# Patient Record
Sex: Female | Born: 1965
Health system: Southern US, Community
[De-identification: ages and names within clinical notes are randomized; demographics above are authoritative.]

## PROBLEM LIST (undated history)

## (undated) DIAGNOSIS — R002 Palpitations: Secondary | ICD-10-CM

## (undated) DIAGNOSIS — G43909 Migraine, unspecified, not intractable, without status migrainosus: Secondary | ICD-10-CM

## (undated) DIAGNOSIS — B349 Viral infection, unspecified: Secondary | ICD-10-CM

## (undated) DIAGNOSIS — F3281 Premenstrual dysphoric disorder: Secondary | ICD-10-CM

## (undated) DIAGNOSIS — K219 Gastro-esophageal reflux disease without esophagitis: Secondary | ICD-10-CM

## (undated) DIAGNOSIS — M1990 Unspecified osteoarthritis, unspecified site: Secondary | ICD-10-CM

## (undated) HISTORY — DX: Migraine, unspecified, not intractable, without status migrainosus: G43.909

## (undated) HISTORY — DX: Gastro-esophageal reflux disease without esophagitis: K21.9

## (undated) HISTORY — PX: OVARIAN CYST SURGERY: SHX726

## (undated) HISTORY — DX: Palpitations: R00.2

## (undated) HISTORY — DX: Unspecified osteoarthritis, unspecified site: M19.90

## (undated) HISTORY — DX: Viral infection, unspecified: B34.9

## (undated) HISTORY — DX: Premenstrual dysphoric disorder: F32.81

---

## 1998-01-12 ENCOUNTER — Ambulatory Visit (HOSPITAL_COMMUNITY): Admission: RE | Admit: 1998-01-12 | Discharge: 1998-01-12 | Payer: Self-pay | Admitting: *Deleted

## 1998-01-12 ENCOUNTER — Encounter: Payer: Self-pay | Admitting: *Deleted

## 1998-02-02 ENCOUNTER — Other Ambulatory Visit: Admission: RE | Admit: 1998-02-02 | Discharge: 1998-02-02 | Payer: Self-pay | Admitting: Gynecology

## 1998-12-24 ENCOUNTER — Other Ambulatory Visit: Admission: RE | Admit: 1998-12-24 | Discharge: 1998-12-24 | Payer: Self-pay | Admitting: Gynecology

## 1999-06-03 ENCOUNTER — Other Ambulatory Visit: Admission: RE | Admit: 1999-06-03 | Discharge: 1999-06-03 | Payer: Self-pay | Admitting: Gynecology

## 2000-07-18 ENCOUNTER — Other Ambulatory Visit: Admission: RE | Admit: 2000-07-18 | Discharge: 2000-07-18 | Payer: Self-pay | Admitting: Gynecology

## 2003-07-07 ENCOUNTER — Encounter: Admission: RE | Admit: 2003-07-07 | Discharge: 2003-07-07 | Payer: Self-pay | Admitting: Cardiovascular Disease

## 2005-01-10 ENCOUNTER — Encounter: Payer: Self-pay | Admitting: Cardiology

## 2005-04-14 ENCOUNTER — Ambulatory Visit: Payer: Self-pay | Admitting: Psychiatry

## 2005-09-14 ENCOUNTER — Ambulatory Visit: Payer: Self-pay | Admitting: Gastroenterology

## 2005-09-19 ENCOUNTER — Ambulatory Visit: Payer: Self-pay | Admitting: Internal Medicine

## 2006-09-29 ENCOUNTER — Encounter: Payer: Self-pay | Admitting: Cardiology

## 2006-10-02 ENCOUNTER — Encounter: Payer: Self-pay | Admitting: Cardiology

## 2006-11-13 ENCOUNTER — Ambulatory Visit: Payer: Self-pay | Admitting: Gastroenterology

## 2006-11-13 LAB — CONVERTED CEMR LAB
ALT: 14 units/L (ref 0–35)
AST: 18 units/L (ref 0–37)
Albumin: 4.2 g/dL (ref 3.5–5.2)
CO2: 31 meq/L (ref 19–32)
Calcium: 9.3 mg/dL (ref 8.4–10.5)
Creatinine, Ser: 0.7 mg/dL (ref 0.4–1.2)
GFR calc Af Amer: 120 mL/min
HCT: 39.7 % (ref 36.0–46.0)
Lipase: 22 units/L (ref 11.0–59.0)
Lymphocytes Relative: 25.7 % (ref 12.0–46.0)
MCHC: 34.7 g/dL (ref 30.0–36.0)
MCV: 93.9 fL (ref 78.0–100.0)
Platelets: 273 10*3/uL (ref 150–400)
Potassium: 4 meq/L (ref 3.5–5.1)
RBC: 4.23 M/uL (ref 3.87–5.11)
RDW: 12.1 % (ref 11.5–14.6)
Sodium: 144 meq/L (ref 135–145)
Total Bilirubin: 0.9 mg/dL (ref 0.3–1.2)
Total Protein: 7 g/dL (ref 6.0–8.3)

## 2006-11-14 ENCOUNTER — Ambulatory Visit: Payer: Self-pay | Admitting: Gastroenterology

## 2007-01-20 ENCOUNTER — Emergency Department (HOSPITAL_COMMUNITY): Admission: EM | Admit: 2007-01-20 | Discharge: 2007-01-20 | Payer: Self-pay | Admitting: Emergency Medicine

## 2007-01-30 ENCOUNTER — Ambulatory Visit: Payer: Self-pay | Admitting: Gastroenterology

## 2007-03-22 DIAGNOSIS — K219 Gastro-esophageal reflux disease without esophagitis: Secondary | ICD-10-CM | POA: Insufficient documentation

## 2007-03-22 DIAGNOSIS — G43709 Chronic migraine without aura, not intractable, without status migrainosus: Secondary | ICD-10-CM

## 2007-03-22 DIAGNOSIS — G43909 Migraine, unspecified, not intractable, without status migrainosus: Secondary | ICD-10-CM | POA: Insufficient documentation

## 2007-03-22 DIAGNOSIS — J309 Allergic rhinitis, unspecified: Secondary | ICD-10-CM | POA: Insufficient documentation

## 2007-03-22 DIAGNOSIS — I059 Rheumatic mitral valve disease, unspecified: Secondary | ICD-10-CM | POA: Insufficient documentation

## 2007-12-07 ENCOUNTER — Encounter: Payer: Self-pay | Admitting: Cardiology

## 2008-01-03 ENCOUNTER — Ambulatory Visit (HOSPITAL_COMMUNITY): Admission: RE | Admit: 2008-01-03 | Discharge: 2008-01-03 | Payer: Self-pay

## 2008-06-17 ENCOUNTER — Encounter: Payer: Self-pay | Admitting: Cardiology

## 2009-05-13 ENCOUNTER — Telehealth (INDEPENDENT_AMBULATORY_CARE_PROVIDER_SITE_OTHER): Payer: Self-pay | Admitting: *Deleted

## 2009-07-31 ENCOUNTER — Ambulatory Visit: Payer: Self-pay | Admitting: Cardiology

## 2009-07-31 DIAGNOSIS — F17200 Nicotine dependence, unspecified, uncomplicated: Secondary | ICD-10-CM | POA: Insufficient documentation

## 2009-07-31 DIAGNOSIS — R079 Chest pain, unspecified: Secondary | ICD-10-CM | POA: Insufficient documentation

## 2009-07-31 DIAGNOSIS — R002 Palpitations: Secondary | ICD-10-CM | POA: Insufficient documentation

## 2009-12-07 ENCOUNTER — Telehealth: Payer: Self-pay | Admitting: Cardiology

## 2009-12-07 ENCOUNTER — Emergency Department (HOSPITAL_COMMUNITY): Admission: EM | Admit: 2009-12-07 | Discharge: 2009-12-07 | Payer: Self-pay | Admitting: Emergency Medicine

## 2010-01-29 ENCOUNTER — Ambulatory Visit: Payer: Self-pay | Admitting: Cardiology

## 2010-01-29 ENCOUNTER — Telehealth: Payer: Self-pay | Admitting: Cardiology

## 2010-01-29 LAB — CONVERTED CEMR LAB
ALT: 16 U/L
AST: 18 U/L
Albumin: 4.5 g/dL
Alkaline Phosphatase: 64 U/L
BUN: 9 mg/dL
Bilirubin, Direct: 0.1 mg/dL
CO2: 29 meq/L
Calcium: 9.4 mg/dL
Chloride: 103 meq/L
Creatinine, Ser: 0.7 mg/dL
GFR calc non Af Amer: 94.15 mL/min
Glucose, Bld: 84 mg/dL
Potassium: 3.9 meq/L
Sodium: 140 meq/L
TSH: 0.88 u[IU]/mL
Total Bilirubin: 1 mg/dL
Total Protein: 7.2 g/dL

## 2010-02-11 ENCOUNTER — Telehealth: Payer: Self-pay | Admitting: Gastroenterology

## 2010-02-15 ENCOUNTER — Ambulatory Visit: Payer: Self-pay | Admitting: Gastroenterology

## 2010-02-15 DIAGNOSIS — R109 Unspecified abdominal pain: Secondary | ICD-10-CM | POA: Insufficient documentation

## 2010-02-16 ENCOUNTER — Encounter
Admission: RE | Admit: 2010-02-16 | Discharge: 2010-02-16 | Payer: Self-pay | Source: Home / Self Care | Attending: Psychiatry | Admitting: Psychiatry

## 2010-02-23 ENCOUNTER — Ambulatory Visit: Payer: Self-pay | Admitting: Cardiology

## 2010-02-23 ENCOUNTER — Ambulatory Visit: Payer: Self-pay

## 2010-03-30 ENCOUNTER — Ambulatory Visit
Admission: RE | Admit: 2010-03-30 | Discharge: 2010-03-30 | Payer: Self-pay | Source: Home / Self Care | Attending: Gastroenterology | Admitting: Gastroenterology

## 2010-03-30 ENCOUNTER — Other Ambulatory Visit: Payer: Self-pay | Admitting: Gastroenterology

## 2010-03-30 DIAGNOSIS — R300 Dysuria: Secondary | ICD-10-CM | POA: Insufficient documentation

## 2010-03-30 DIAGNOSIS — B370 Candidal stomatitis: Secondary | ICD-10-CM | POA: Insufficient documentation

## 2010-03-30 LAB — URINALYSIS
Bilirubin Urine: NEGATIVE
Specific Gravity, Urine: <=1.005 (ref 1.000–1.030)
Urine Glucose: NEGATIVE
pH: 6 (ref 5.0–8.0)

## 2010-04-01 NOTE — Progress Notes (Signed)
Summary: pt having pain /wants appt today   Phone Note Call from Patient   Caller: Patient [phone] Reason for Call: Talk to Nurse Summary of Call: pt calling re pain in her ribs, stomach and shoulders, denies chest pain, sob, lightheadedness, has some dizziness but associates this with a poss oncoming migraine-wants an appt today-pls advise  Initial call taken by: Glynda Jaeger,  January 29, 2010 10:04 AM  Follow-up for Phone Call        pt complains of not feeling well.  states that she was seen in the ED one month ago and was told to followup with Dr Antoine Poche but has not.  Pt states she has arm weakness and pain in her back that radiates to arm ribs and in to her arms.  She doesn't have a primary MD that can see her today and wants to be evaluated here by Dr Antoine Poche.  Appt given for 12N Follow-up by: Charolotte Capuchin, RN,  January 29, 2010 11:03 AM

## 2010-04-01 NOTE — Assessment & Plan Note (Signed)
Summary: Nausea/Abdominal Pain/LRH   History of Present Illness Visit Type: Follow-up Visit Primary GI MD: Elie Goody MD [facility] Primary Provider: [name], MD Chief Complaint: nausea and abdominal pain radiating to back  History of Present Illness:   Mrs. Westerfeld relates problems with chest pain, abdominal pain, and nausea. She was evaluated in the emergency room in October and I have reviewed those records. She states she took Prilosec OTC, which helped her reflux symptoms, but led to abdominal cramps and therefore, she discontinued it. Ranitidine led to increased gas and she discontinued it. She been maintained on Protonix 40 mg h.s. for about 2 months with good control of her reflux symptoms.  However, she has persistent problems with sharp chest pains along her anterior chest wall and bilateral ribs and nausea. She relates intermittent, crampy, lower abdominal discomfort associated with a slight irregularity in bowel habits.   GI Review of Systems    Reports abdominal pain, chest pain, loss of appetite, and  nausea.     Location of  Abdominal pain: lower abdomen.    Denies acid reflux, belching, bloating, dysphagia with liquids, dysphagia with solids, heartburn, vomiting, vomiting blood, weight loss, and  weight gain.      Reports change in bowel habits.     Denies anal fissure, black tarry stools, constipation, diarrhea, diverticulosis, fecal incontinence, heme positive stool, hemorrhoids, irritable bowel syndrome, jaundice, light color stool, liver problems, rectal bleeding, and  rectal pain.   Current Medications (verified): 1)  Atenolol 25 Mg Tabs (Atenolol) .Marland Kitchen.. 1 By Mouth At Bedtime 2)  Protonix 40 Mg Solr (Pantoprazole Sodium) .Marland Kitchen.. 1 By Mouth Daily  Allergies (verified): 1)  ! Sulfa 2)  ! Penicillin  Past History:  Past Medical History: MIGRAINE (ICD-346.90) RHINITIS (ICD-477.9) MITRAL VALVE PROLAPSE (ICD-424.0) GERD (ICD-530.81) Cervical and thoracic disk  disease  Past Surgical History: Reviewed history from 03/22/2007 and no changes required. S/P L ovarian dermoid cyst removal  Family History: Reviewed history from 07/31/2009 and no changes required. Mother with [place] onset PVD  No FH of Colon Cancer:  Social History: Reviewed history from 07/31/2009 and no changes required. Non-drinker,  no drug abuse,  current smoker (24 years 1 ppd, was more),  T hree glasses sweet tea, and 2 cups coffee per day.   Married, 1 child  Review of Systems       The pertinent positives and negatives are noted as above and in the HPI. All other ROS were reviewed and were negative.   Vital Signs:  Patient profile:   45 year old female Height:      64 inches Weight:      123 pounds BMI:     21.19 Pulse rate:   84 / minute Pulse rhythm:   regular BP sitting:   128 / 84  (left arm)  Vitals Entered By: Milford Cage NCMA (February 15, 2010 10:36 AM)  Physical Exam  General:  Well developed, well nourished, no acute distress. Head:  Normocephalic and atraumatic. Eyes:  PERRLA, no icterus. Mouth:  No deformity or lesions, dentition normal. Chest Wall:  Anterior and lateral chest wall tenderness bilaterally to palpation Lungs:  Clear throughout to auscultation. Heart:  Regular rate and rhythm; no murmurs, rubs,  or bruits. Abdomen:  Soft and nondistended. No masses, hepatosplenomegaly or hernias noted. Normal bowel sounds. Minimal lower abdominal tenderness to deep palpation. Neurologic:  Alert and  oriented x4;  grossly normal neurologically. Psych:  anxious.    Impression & Recommendations:  Problem # 1:  CHEST PAIN (ICD-786.50) Multifactorial chest pain, primarily musculoskeletal. Further evaluation per her primary physician. There may be a component of GERD exacerbating her chest wall pain as well.  Problem # 2:  GERD (ICD-530.81) Increase pantoprazole to 40 mg twice daily, taken 30 minutes before breakfast and dinner. Intensify all  standard antireflux measures. May use TUMS p.r.n. for breakthrough symptoms.  Problem # 3:  ABDOMINAL PAIN-MULTIPLE SITES (ICD-789.09) Lower abdominal crampy pain, associated with gas and changes in bowel habits. Glycopyrrolate 1 mg twice daily. If symptoms do not adequately respond, consider further evaluation with FOBT, imaging and possibly colonoscopy.  Patient Instructions: 1)  Pick up your prescriptions from your pharmacy.  2)  Avoid foods high in acid content ( tomatoes, citrus juices, spicy foods) . Avoid eating within 3 to 4 hours of lying down or before exercising. Do not over eat; try smaller more frequent meals. Elevate head of bed four inches when sleeping.  3)  Copy sent to : [name], MD 4)  Please schedule a follow-up appointment in 6 weeks.  5)  The medication list was reviewed and reconciled.  All changed / newly prescribed medications were explained.  A complete medication list was provided to the patient / caregiver.  Prescriptions: ROBINUL 1 MG TABS (GLYCOPYRROLATE) one tablet by mouth two times a day  #60 x 11   Entered by:   Christie Nottingham CMA (AAMA)   Authorized by:   Meryl Dare MD [facility]   Signed by:   Christie Nottingham CMA (AAMA) on 02/15/2010   Method used:   Electronically to        [facility]  [id number] (retail[facility]       [address]       [place], [place]  [address]       Ph: [phone] or [phone]       Fax: [phone]   RxID:   [id number] PANTOPRAZOLE SODIUM 40 MG TBEC (PANTOPRAZOLE SODIUM) one tablet by mouth two times a day 30 minutes before breakfast and dinner  #60 x 11   Entered by:   Christie Nottingham CMA (AAMA)   Authorized by:   Meryl Dare MD [facility]   Signed by:   Christie Nottingham CMA (AAMA) on 02/15/2010   Method used:   [facility]        [facility]  [address] (retail[facility]       [address]       [place], [place]  [address]       Ph: [phone] or [phone]       Fax: [phone]   RxID:    [id number]

## 2010-04-01 NOTE — Assessment & Plan Note (Signed)
Summary: pain in back and into chest/arm weakness  Medications Added PROTONIX 40 MG SOLR (PANTOPRAZOLE SODIUM) 1 by mouth daily      Allergies Added:   Visit Type:  Follow-up Primary [name]:  [name], MD  CC:  chest pain.  History of Present Illness: The patient presents for followup of chest discomfort. [name] [name] presented earlier in the year with complaints of atypical pain and was to have an exercise treadmill test. However, [name] [name] canceled this appointment and did not reschedule. [name] [name] says [name] [name] was bitten by a brown recluse which caused her significant problems and inability to walk. [name] [name] now returns for a myriad of symptoms. [name] [name] has a weak feeling like [name] [name] is "coming down with something." [name] [name] is having chest discomfort and [name] [name] points to her lower ribs spreading down into her abdomen and back. [name] [name] says it is there all the time but it "comes and goes". [name] [name] thinks it is getting worse and happens at rest. When [name] [name] tries to work he feels particularly weak especially in her arms. [name] [name] was in the emergency room in October and I reviewed these records. [name] [name] was having chest pain and was felt to be GI in nature. [name] [name] was treated with Mylanta and Protonix. [name] [name] denies any new shortness of breath, PND or orthopnea. [name] [name] denies any new palpitations, presyncope or syncope. [name] [name] continues to smoke and has stress as described in previous notes.  Current Medications (verified): 1)  Atenolol 25 Mg Tabs (Atenolol) .Marland Kitchen.. 1 By Mouth At Bedtime 2)  Protonix 40 Mg Solr (Pantoprazole Sodium) .Marland Kitchen.. 1 By Mouth Daily  Allergies (verified): 1)  ! Sulfa  Past History:  Past Medical History: Reviewed history from 03/22/2007 and no changes required. MIGRAINE (ICD-346.90) RHINITIS (ICD-477.9) MITRAL VALVE PROLAPSE (ICD-424.0) G E R D (ICD-530.81)  Past Surgical History: Reviewed history from 03/22/2007 and no changes required. S/P L ovarian dermoid cyst removal  Review of Systems       As stated in the HPI and  negative for all other systems.   Vital Signs:  Patient profile:   45 year old female Height:      64 inches Weight:      119 pounds BMI:     20.50 Pulse rate:   87 / minute Resp:     16 per minute BP sitting:   140 / 80  (right arm)  Vitals Entered By: Marrion Coy, CNA (January 29, 2010 12:19 PM)  Physical Exam  General:  Well developed, well nourished, in no acute distress. Head:  normocephalic and atraumatic Eyes:  PERRLA/EOM intact; conjunctiva and lids normal. Mouth:  Teeth, gums and palate normal. Oral mucosa normal. Neck:  Neck supple, no JVD. No masses, thyromegaly or abnormal cervical nodes. Chest Wall:  no deformities or breast masses noted Lungs:  Clear bilaterally to auscultation and percussion. Abdomen:  Bowel sounds positive; abdomen soft and non-tender without masses, organomegaly, or hernias noted. No hepatosplenomegaly. Msk:  Back normal, normal gait. Muscle strength and tone normal. Extremities:  No clubbing or cyanosis. Neurologic:  Alert and oriented x 3. Skin:  Intact without lesions or rashes. Cervical Nodes:  no significant adenopathy Inguinal Nodes:  no significant adenopathy Psych:  Normal affect.   Detailed Cardiovascular Exam  Neck    Carotids: Carotids full and equal bilaterally without bruits.      Neck Veins: Normal, no JVD.    Heart    Inspection: no deformities or lifts noted.      Palpation: normal PMI  with no thrills palpable.      Auscultation: regular rate and rhythm, S1, S2 without murmurs, rubs, gallops, or clicks.    Vascular    Abdominal Aorta: no palpable masses, pulsations, or audible bruits.      Femoral Pulses: normal femoral pulses bilaterally.      Pedal Pulses: normal pedal pulses bilaterally.      Radial Pulses: normal radial pulses bilaterally.      Peripheral Circulation: no clubbing, cyanosis, or edema noted with normal capillary refill.     EKG  Procedure date:  01/29/2010  Findings:      sinus rhythm,  rate 74, incomplete right bundle branch block, no acute ST-T wave changes  Impression & Recommendations:  Problem # 1:  CHEST PAIN (ICD-786.50) The patient has many atypical symptoms. However, [name] [name] has multiple risk factors. I would still like to do an exercise treadmill test. Provided this is normal I would not be suggesting further cardiovascular testing but will refer her back to her primary physician. Because of fatigue and other symptoms I will check a basic metabolic profile and TSH as well. Orders: EKG w/ Interpretation (93000) TLB-TSH (Thyroid Stimulating Hormone) (84443-TSH) TLB-BMP (Basic Metabolic Panel-BMET) (80048-METABOL) TLB-Hepatic/Liver Function Pnl (80076-HEPATIC) Treadmill (Treadmill)  Problem # 2:  NICOTINE ADDICTION (ICD-305.1) We have discussed this at length. [name] [name] think [name] [name] would try nicotine patches which is what [name] [name] said before.  Problem # 3:  PALPITATIONS (ICD-785.1) [name] [name] has had no new palpitations. Labs will be drawn as mentioned. Otherwise no change in therapy is planned. Orders: EKG w/ Interpretation (93000) TLB-TSH (Thyroid Stimulating Hormone) (84443-TSH) TLB-BMP (Basic Metabolic Panel-BMET) (80048-METABOL) TLB-Hepatic/Liver Function Pnl (80076-HEPATIC)  Patient Instructions: 1)  Your physician recommends that you schedule a follow-up appointment at the time of your treadmill 2)  Your physician recommends that you have lab work today 3)  Your physician recommends that you continue on your current medications as directed. Please refer to the Current Medication list given to you today. 4)  Your physician has requested that you have an exercise tolerance test.  For further information please visit [email or web address]  Please also follow instruction sheet, as given.

## 2010-04-01 NOTE — Letter (Signed)
Summary: [facility] Office Note  [facility] Office Note   Imported By: Roderic Ovens 08/10/2009 12:23:08  _____________________________________________________________________  External Attachment:    Type:   Image     Comment:   External Document

## 2010-04-01 NOTE — Progress Notes (Signed)
Summary: triage   Phone Note Call from Patient Call back at Home Phone [phone] Call back at cell -- [phone]   Caller: Patient Call For: Dr. Russella Dar Details for Reason: triage Summary of Call: Pt. calls asking to be worked into Dr. Ardell Isaacs schedule asap.  Pt. complains of feeling nauseated constantly, stomach hurts up into backbone, been nonstop x 2 months, to the point she can't take it anymore. Feels shaky, weak. Initial call taken by: Schuyler Amor,  February 11, 2010 9:31 AM  Follow-up for Phone Call        Spoke with patient and gave her an appointment for Monday 02/15/10 @ 10:30am. Chart ordered. Follow-up by: Selinda Michaels RN,  February 11, 2010 9:46 AM

## 2010-04-01 NOTE — Letter (Signed)
Summary: [facility] Return Visit Note  [facility] Return Visit Note   Imported By: Roderic Ovens 08/10/2009 12:24:12  _____________________________________________________________________  External Attachment:    Type:   Image     Comment:   External Document

## 2010-04-01 NOTE — Letter (Signed)
Summary: [facility] Return Visit Note  [facility] Return Visit Note   Imported By: Roderic Ovens 08/10/2009 12:23:42  _____________________________________________________________________  External Attachment:    Type:   Image     Comment:   External Document

## 2010-04-01 NOTE — Assessment & Plan Note (Signed)
Summary: NP6/ [employer]/ PALPS. GERD. / GD  Medications Added ATENOLOL 25 MG TABS (ATENOLOL) 1 by mouth at bedtime      Allergies Added: ! SULFA  Visit Type:  Initial Consult Primary Provider:  [name], MD  CC:  palpitations.  History of Present Illness: The patient presents for evaluation of palpitations with a history of mitral valve prolapse. She also has chest discomfort. She has had palpitations for years and has seen another cardiologist. These have been managed conservatively. I do see that she's had an exercise echocardiogram in 2008 without evidence of ischemia or infarct. There's been any history of mitral valve prolapse though I see an echo that demonstrated none of this in 2005. She says that over the last several months palpitations or come back and increased in frequency. She describes isolated skipped beats that happen more when she's eating. She doesn't describe any sustained tachycardia arrhythmias. She hasn't had any presyncope or syncope. She's had some dizziness and vertigo in the past. She does drink quite a bit of caffeine as described below. She has stress taking care of sick father and mother in a young son as well as being a Horticulturist, commercial. She does get chest discomfort. This happens more when she's having migraines. However, she did have isolated episodes of chest discomfort with or without activity. It seems to be sharp. She doesn't describe radiation. It comes and goes spontaneously. It is difficult for her to quantify.  Current Medications (verified): 1)  Atenolol 25 Mg Tabs (Atenolol) .Marland Kitchen.. 1 By Mouth At Bedtime  Allergies (verified): 1)  ! Sulfa  Past History:  Past Medical History: Last updated: 03/22/2007 MIGRAINE (ICD-346.90) RHINITIS (ICD-477.9) MITRAL VALVE PROLAPSE (ICD-424.0) G E R D (ICD-530.81)  Past Surgical History: Reviewed history from 03/22/2007 and no changes required. S/P L ovarian dermoid cyst removal  Family History: Mother  with [place] onset PVD   Social History: Non-drinker, no drug abuse, current smoker (24 years 1 ppd, was more),  Three glasses sweet tea, and 2 cups coffee per day.  Married, 1 child  Review of Systems       positive for jaw pain, reflux. Negative for all other systems.  Vital Signs:  Patient profile:   45 year old female Height:      64 inches Weight:      119 pounds BMI:     20.50 Pulse rate:   73 / minute Resp:     16 per minute BP sitting:   112 / 76  (right arm)  Vitals Entered By: Marrion Coy, CNA (July 31, 2009 10:59 AM)  Physical Exam  General:  Well developed, well nourished, in no acute distress. Head:  normocephalic and atraumatic Eyes:  PERRLA/EOM intact; conjunctiva and lids normal. Mouth:  Teeth, gums and palate normal. Oral mucosa normal. Neck:  Neck supple, no JVD. No masses, thyromegaly or abnormal cervical nodes. Chest Wall:  no deformities or breast masses noted Lungs:  Clear bilaterally to auscultation and percussion. Abdomen:  Bowel sounds positive; abdomen soft and non-tender without masses, organomegaly, or hernias noted. No hepatosplenomegaly. Msk:  Back normal, normal gait. Muscle strength and tone normal. Extremities:  No clubbing or cyanosis. Neurologic:  Alert and oriented x 3. Skin:  Intact without lesions or rashes. Cervical Nodes:  no significant adenopathy Inguinal Nodes:  no significant adenopathy Psych:  Normal affect.   Detailed Cardiovascular Exam  Neck    Carotids: Carotids full and equal bilaterally without bruits.  Neck Veins: Normal, no JVD.    Heart    Inspection: no deformities or lifts noted.      Palpation: normal PMI with no thrills palpable.      Auscultation: regular rate and rhythm, S1, S2 without murmurs, rubs, gallops, or clicks.    Vascular    Abdominal Aorta: no palpable masses, pulsations, or audible bruits.      Femoral Pulses: normal femoral pulses bilaterally.      Pedal Pulses: normal pedal pulses  bilaterally.      Radial Pulses: normal radial pulses bilaterally.      Peripheral Circulation: no clubbing, cyanosis, or edema noted with normal capillary refill.     EKG  Procedure date:  07/31/2009  Findings:      Sinus rhythm, rate 73, axis within normal limits, intervals within normal limits, no acute ST-T wave changes.  Impression & Recommendations:  Problem # 1:  PALPITATIONS (ICD-785.1) At this point she needs to cut out the caffeine if she wants to treat these. We had a long discussion about this. The same palpitations apparenty had evaluated by cardiologist has some of these records. She would not tolerate further med changes she has not in the past. I would not suggest further testing at this time. Orders: TLB-Lipid Panel (80061-LIPID) Treadmill (Treadmill) In the chest  Problem # 2:  NICOTINE ADDICTION (ICD-305.1) I counseled her for greater than 3 minutes about the need to stop smoking. She probably will try nicotine patches.  Problem # 3:  CHEST PAIN (ICD-786.50) This is somewhat atypical. I will plan an exercise treadmill test.  Patient Instructions: 1)  Your physician recommends that you schedule a follow-up appointment at time of stress test 2)  Your physician recommends that you continue on your current medications as directed. Please refer to the Current Medication list given to you today. 3)  Your physician has requested that you have an exercise tolerance test.  For further information please visit [email or web address]  Please also follow instruction sheet, as given. 4)  Your physician recommends that you return for a FASTING lipid profile: on the same day as your treadmill

## 2010-04-01 NOTE — Letter (Signed)
Summary: [facility] 2005 - 2006  [facility] 2005 - 2006   Imported By: Roderic Ovens 08/10/2009 12:25:25  _____________________________________________________________________  External Attachment:    Type:   Image     Comment:   External Document

## 2010-04-01 NOTE — Progress Notes (Signed)
Summary: pt having problem with heart    Phone Note Call from Patient   Caller: Patient [phone]  or [phone] Reason for Call: Talk to Nurse Summary of Call: pt having some chest pain/back pain, itching, heart "jumping up in her throat, said it's not staying there, it does come back down", does it about twice a day, sometimes more Initial call taken by: Glynda Jaeger,  December 07, 2009 11:23 AM  Follow-up for Phone Call        attempted to call 1st number and got voice mail. Cell phone number - 1 month ago - threw her back out and has had dull chest pain since then - heart "jumps up into her throat, beating fast" - under alot of stress - pain woke her last night from her sleep at 3 am. had upset stomach for 3 weeks fills full all the time, eats and then feels sick, stomach bloated.  heart rate increases for about  5 minutes and then returns to normal, for 3 weeks she reports having "chest pain  that goes all the way thru to her back bone"  has had chest pain around her breast bone for about 2 weeks, constant dull pain.  feels like everything is  drawn up in a knot.  Family HX of CVA, CAD but no personal history, smokes about 1 pack per day, no hormone replacement. didn't have GXT before because she was bitten by a spider and leg was hurting and draining.  no energy, feels sickly.  primary care MD said it was sinus stuff.  Reviewed the above information with Dr Antoine Poche who requested the pt report to the ED at [facility] for evaluation.  called pt back but got a vm - left message for pt to report to ED per Dr Antoine Poche Follow-up by: Charolotte Capuchin, RN,  December 07, 2009 12:28 PM  Additional Follow-up for Phone Call Additional follow up Details #1::        pt aware and will report to the ED. Additional Follow-up by: Charolotte Capuchin, RN,  December 07, 2009 1:45 PM

## 2010-04-01 NOTE — Progress Notes (Signed)
   Recieved records form [facility] forwarded to [facility] for NP [facility]t [facility]  May 13, 2009 11:35 AM

## 2010-04-07 NOTE — Assessment & Plan Note (Signed)
Summary: 6 WEEK+ FU Marnee Guarneri   History of Present Illness Visit Type: Follow-up Visit Primary GI MD: Elie Goody MD [facility] Primary Provider: [name], MD Requesting Provider: n/a Chief Complaint: Patient here for f/u abdominal pain and nausea. She states that she was recently found to have a UTI and still has some lower abdominal pain. She also c/o "thrush in mouth" as well as some nausea. There has also been some pill dysphagia History of Present Illness:   This is a 45 year old female, who returns for followup of GERD and crampy abdominal pain. She developed a urinary tract infection recently, and was treated with nitrofurantoin and since then she has developed pain and her mouth and tongue and a lump in her throat. She also has persistent dysuria. She discontinued Protonix and her reflux symptoms have substantially worsened. She has occasional lower abdominal crampy pains associated with gas that have improved when she sees Robinul, however, she's taken Robinul sparingly.   GI Review of Systems    Reports abdominal pain, acid reflux, bloating, loss of appetite, and  nausea.     Location of  Abdominal pain: lower abdomen.    Denies belching, chest pain, dysphagia with liquids, dysphagia with solids, heartburn, vomiting, vomiting blood, weight loss, and  weight gain.        Denies anal fissure, black tarry stools, change in bowel habit, constipation, diarrhea, diverticulosis, fecal incontinence, heme positive stool, hemorrhoids, irritable bowel syndrome, jaundice, light color stool, liver problems, rectal bleeding, and  rectal pain.   Current Medications (verified): 1)  Atenolol 25 Mg Tabs (Atenolol) .Marland Kitchen.. 1 By Mouth At Bedtime 2)  Robinul 1 Mg Tabs (Glycopyrrolate) .... One Tablet By Mouth Two Times A Day As Needed 3)  Cyclobenzaprine Hcl 10 Mg Tabs (Cyclobenzaprine Hcl) .... Take 1 Tablet By Mouth At Bedtime  Allergies (verified): 1)  ! Sulfa 2)  ! Penicillin  Past  History:  Past Medical History: Reviewed history from 02/15/2010 and no changes required. MIGRAINE (ICD-346.90) RHINITIS (ICD-477.9) MITRAL VALVE PROLAPSE (ICD-424.0) GERD (ICD-530.81) Cervical and thoracic disk disease  Past Surgical History: S/P L ovarian dermoid cyst removal C-Section  Family History: Mother with Mavity onset PVD  No FH of Colon Cancer: Family History of Breast Cancer: PGM Family History of Ovarian Cancer: MGM Family History of Clotting disorder: Mother Family History of Cirrhosis: Personal assistant, hx diverticulitis  Social History: no drug abuse,  current smoker (24 years <1 ppd, was more),   Three glasses sweet tea, and 2 cups coffee per day.   Married, 1 child Alcohol Use - yes-2 glass wine weekend  Review of Systems       The patient complains of anxiety-new, arthritis/joint pain, back pain, menstrual pain, night sweats, thirst - excessive, and urination changes/pain.         The pertinent positives and negatives are noted as above and in the HPI. All other ROS were reviewed and were negative.   Vital Signs:  Patient profile:   45 year old female Height:      64 inches Weight:      124.50 pounds BMI:     21.45 BSA:     1.60 Pulse rate:   84 / minute Pulse rhythm:   regular BP sitting:   108 / 70  (left arm)  Vitals Entered By: Lamona Curl CMA Duncan Dull) (March 30, 2010 9:41 AM)  Physical Exam  General:  Well developed, well nourished, no acute distress. Head:  Normocephalic and atraumatic. Mouth:  white/brown exudate on tongue and posterior pharynx.   Lungs:  Clear throughout to auscultation. Heart:  Regular rate and rhythm; no murmurs, rubs,  or bruits. Abdomen:  Soft, nontender and nondistended. No masses, hepatosplenomegaly or hernias noted. Normal bowel sounds. Neurologic:  Alert and  oriented x4;  grossly normal neurologically. Psych:  Alert and cooperative. Normal mood and affect.  Impression &  Recommendations:  Problem # 1:  CANDIDIASIS OF MOUTH (ICD-112.0) Nystatin swish and swallow for 10 days.  Problem # 2:  DYSURIA (ICD-788.1) Rule out persistent urinary tract infection. Will provide these results to the patient and her primary physician or gynecologist for further management. Orders: TLB-Udip ONLY (81003-UDIP)  Problem # 3:  ABDOMINAL PAIN-MULTIPLE SITES (ICD-789.09) She is encouraged to use of Robinul twice daily on a regular basis to help prevent crampy lower abdominal pain and if her symptoms are not well controlled she will contact us.  Problem # 4:  G E R D (ICD-530.81) Resume pantoprazole 40 mg daily, and standard antireflux measures for long-term management. Long-term compliance with therapy for acid suppression was stressed for optimal control of her reflux symptoms.  Patient Instructions: 1)  Please go directly to the basement to have your labs drawn.  2)  Pick up your prescription from your pharmacy. 3)  Continue Robinul one tablet by mouth two times a day. 4)  Restart Protonix one tablet by mouth every morning.  5)  Copy sent to : [name], MD 6)  The medication list was reviewed and reconciled.  All changed / newly prescribed medications were explained.  A complete medication list was provided to the patient / caregiver. 7)  Please schedule a follow-up appointment in 1 year.  Prescriptions: FIRST-DUKES MOUTHWASH  SUSP (DIPHENHYD-HYDROCORT-NYSTATIN) 5 cc swish and swallow four times a day x 10 days  #212mL x 0   Entered by:   Christie Nottingham CMA (AAMA)   Authorized by:   Meryl Dare MD [facility]   Signed by:   Christie Nottingham CMA (AAMA) on 03/30/2010   Method used:   Electronically to        [facility]  [address] (retail)       [address]       [place], [place]  [address]       Ph: [phone] or [phone]       Fax: [phone]   RxID:   [id number] FIRST-DUKES MOUTHWASH  SUSP (DIPHENHYD-HYDROCORT-NYSTATIN) swish and swallow  four times a day x 10 days  #40 x 0   Entered by:   Christie Nottingham CMA (AAMA)   Authorized by:   Meryl Dare MD [facility]   Signed by:   Christie Nottingham CMA (AAMA) on 03/30/2010   Method used:   [facility]        [facility]  [address] (retail)       [address]       [place], [place]  [address]       Ph: [phone] or [phone]       Fax: [phone]   RxID:   [id number]

## 2010-05-13 LAB — POCT I-STAT, CHEM 8
BUN: 5 mg/dL — ABNORMAL LOW (ref 6–23)
Calcium, Ion: 1.23 mmol/L (ref 1.12–1.32)
Creatinine, Ser: 0.9 mg/dL (ref 0.4–1.2)
Glucose, Bld: 96 mg/dL (ref 70–99)
HCT: 41 % (ref 36.0–46.0)

## 2010-05-13 LAB — POCT CARDIAC MARKERS
CKMB, poc: <1 ng/mL — ABNORMAL LOW (ref 1.0–8.0)
Troponin i, poc: <0.05 ng/mL (ref 0.00–0.09)

## 2010-07-13 NOTE — Assessment & Plan Note (Signed)
[facility]                         GASTROENTEROLOGY OFFICE NOTE   NAME:[name] [name], [name] [name]                         MRN:          [id number]  DATE:11/13/2006                            DOB:          19-Mar-1965    This is a 45 year old white female who I have previously evaluated - see  my prior dictations. She relates a return in burning epigastric pain  with associated mild substernal burning. She was treated with Zantac  which improved her symptoms but did not they did not resolve. She  switched to Nexium with substantial improvement in symptoms. She  developed recurrent oral thrush and was treated with Dr. [name] with two courses of Diflucan. Nexium was discontinued, and she  was started on Mylanta. Her epigastric pain has substantially worsened.  It now radiates to the back and is exacerbated by meals. She has been  having post-prandial gas bloating and diarrhea since taking Mylanta 4 to  5 times a day. She notes no dysphagia, odynophagia, vomiting, melena,  hematochezia or weight loss. She states she takes an Excedrin Migraine  about once every 2 weeks. She denies any additional aspirin or NSAID  usage. She denies any recent antibiotic usage except for the Diflucan.   CURRENT MEDICATIONS:  1. Mylanta q.i.d.  2. Excedrin Migraine every 2 weeks p.r.n.   MEDICATION ALLERGIES:  SULFA AND PENICILLIN.   PHYSICAL EXAMINATION:  No acute distress. Weight 117.2 pounds. Blood  pressure is 98/62. Pulse 94 and regular.  HEENT EXAM:  Anicteric sclerae. Oropharynx reveals mild posterior  pharyngeal erythema. There are 1 or 2 small white plaques on her  posterior oropharynx, and her tongue has a yellowish brown  discoloration.  NECK:  Without thyromegaly or adenopathy appreciated.  CHEST:  Clear to auscultation bilaterally.  CARDIAC:  Regular rate and rhythm without murmurs appreciate.  ABDOMEN:  Epigastric tenderness to deep palpation. No rebound or  guarding. No palpable organomegaly, masses or hernias. Normal active  bowel sounds.  NEUROLOGICAL:  Alert and oriented x3. Grossly nonfocal.   ASSESSMENT AND PLAN:  Worsening epigastric pain and reflux symptoms.  Symptoms clearly exacerbated after discontinuing protein-pump  inhibitors. She has recurrent oropharyngeal candidiasis. She is likely  having diarrhea side effects of Mylanta. Discontinue Mylanta and begin  Aciphex 20 mg p.o. q.a.m. May use Tums p.r.n. Obtain a CMET, CBC and  lipase. Will need to retreat for oral candidiasis. Rule out esophageal  candidiasis, GERD, erosive esophagitis and ulcer disease. Risks,  benefits and alternatives of upper endoscopy with  possible biopsy discussed with the patient, and she consents to proceed;  this is scheduled tomorrow.     Venita Lick. Russella Dar, MD, [facility]  Electronically Signed    MTS/MedQ  DD: 11/13/2006  DT: 11/13/2006  Job #: [id number]   cc:   [name]

## 2010-07-13 NOTE — Assessment & Plan Note (Signed)
[facility]                         GASTROENTEROLOGY OFFICE NOTE   NAME:[name], [name] DEWALT                         MRN:          [id number]  DATE:01/30/2007                            DOB:          [birth year]    This is a return office visit for GERD.  She relates some lower  abdominal pain that has started over the past week or two after she  developed a cough.  She is being treated with clarithromycin for an URI.  She feels the Aciphex has controlled her reflux symptoms fairly well but  she notes her symptoms are under better control on Protonix twice a day  in the past.   CURRENT MEDICATIONS:  1. Mylanta q.i.d. p.r.n.  2. Clarithromycin 500 mg b.i.d.  3. Aciphex 20 mg q.a.m.   MEDICATION ALLERGIES:  1. SULFA.  2. PENICILLIN.   EXAM:  No acute distress.  Weight 121.2 pounds, blood pressure is  108/72, pulse 84 and regular.  CHEST:  Clear to auscultation bilaterally.  CARDIAC:  Regular rate without murmurs.  ABDOMEN:  Soft with mild lower abdominal tenderness to light palpation.  No rebound or guarding, no palpable organomegaly, masses or hernias.  Normoactive bowel sounds.   ASSESSMENT AND PLAN:  Gastroesophageal reflux disease with good symptom  control.  Continue standard antireflux measures.  Discontinue Aciphex  and begin pantoprazole 40 milligrams by mouth twice daily with doses  taken 30 minutes before breakfast and dinner.  I believe her lower  abdominal pain is likely mild musculoskeletal pain probably related to  her recent cough.  Return office visit with me in 1 year and as needed.     Venita Lick. Russella Dar, MD, [facility]  Electronically Signed    MTS/MedQ  DD: 01/30/2007  DT: 01/30/2007  Job #: [id number]   cc:   [name] [name]

## 2010-07-16 NOTE — Assessment & Plan Note (Signed)
[facility]                           GASTROENTEROLOGY OFFICE NOTE   [name], [name]                         MRN:          [id number]  DATE:09/14/2005                            DOB:          1965/10/12    [name] [name] is a 45 year old white female that I have seen in the past.  Please refer to the notes from January 2001.  She did not return for  followup, and did not follow through with the recommended testing at the  time.  She has had ongoing problems with intermittent reflux symptoms, which  have been more active for the past month.  She resumed Protonix, and her  reflux symptoms appear to be under better control.  She is more concerned  about recurrent problems with left abdominal and left flank pain for the  past 4 to 5 months, that comes and goes intermittently.  It tends to last  about an hour at a time.  It is described as a stabbing and sharp pain, and  she describes it as moderately severe.  It is not related to bowel movements  or eating.  It has not been associated with bloating, movement or bending.  She does have cervical and thoracic disk disease.  She has had recent  problems with her cervical disk, and has a neurosurgeon that has followed  her, including MRIs of the spine.  She saw her gynecologist, Dr. Arvil [name],  and a pelvis ultrasound was performed that was unremarkable.  She does have  a prior history of a left ovarian cyst surgically removed in 1996.  She did  not feel the problem was gynecologic in etiology.  She has no dysphagia,  odynophagia, change in bowel habits, diarrhea, constipation, melena,  hematochezia or change in stool caliber.  Her weight is stable, and her  appetite is good.  No family history of colon cancer, colon polyps or  inflammatory bowel disease.   PAST MEDICAL HISTORY:  1.  Mitral valve prolapse.  2.  Allergies.  3.  GERD.  4.  Migraine headaches.  5.  Status post a dermoid cyst removed from her  left ovary in 1996.   MEDICATION ALLERGIES:  PENICILLIN and SULFA DRUGS.   CURRENT MEDICATIONS:  None.   SOCIAL HISTORY:  Per the handwritten form.   REVIEW OF SYSTEMS:  Per the handwritten form.   PHYSICAL EXAMINATION:  Well-developed, well-nourished white female in no  acute distress.  Height 5 feet 4 inches, weight 115 pounds.  Blood pressure is 122/70, pulse  76 and regular.  HEENT:  Anicteric sclerae.  Oropharynx is clear.  CHEST:  Clear to auscultation bilaterally.  CARDIAC:  Regular rate and rhythm without murmurs appreciated.  ABDOMEN:  Tenderness along the left lateral abdomen and left flank to deep  palpation.  No rebound or guarding, no palpable organomegaly, masses or  hernias.  Normoactive bowel sounds.  RECTAL:  Examination deferred.  EXTREMITIES:  Without clubbing, cyanosis or edema.  NEUROLOGIC:  Alert and oriented x3.  Grossly nonfocal.   ASSESSMENT AND PLAN:  Left abdominal and left flank  pain.  This does not  appear to be of gastrointestinal origin.  I am concerned about  musculoskeletal pain or radicular pain, possibly related to thoracic spine  disease.  We will obtain a CBC, CMET, and lipase today.  Schedule a CT scan  of the abdomen and pelvis.  If these studies are unremarkable, we should  proceed with upper endoscopy to further evaluate her pain and her chronic  intermittent GERD.  I offered to schedule the endoscopy at this time.  However, she declines.  Begin Nexium 40 mg p.o. q. day, as it is preferred  by her insurance carrier.  Begin all standard antireflux measures.                                   [name] [name]. [name] [name]., MD, [name] [name]   MTS/MedQ  DD:  09/14/2005  DT:  09/15/2005  Job #:  [id number]

## 2010-12-07 LAB — I-STAT 8, (EC8 V) (CONVERTED LAB)
BUN: 9
Bicarbonate: 26.2 — ABNORMAL HIGH
Glucose, Bld: 97
HCT: 48 — ABNORMAL HIGH
Hemoglobin: 16.3 — ABNORMAL HIGH
pH, Ven: 7.339 — ABNORMAL HIGH

## 2010-12-07 LAB — CBC
HCT: 43.1
Hemoglobin: 14.8
MCV: 93.1
RBC: 4.63

## 2010-12-07 LAB — DIFFERENTIAL
Basophils Absolute: 0
Lymphs Abs: 1.6
Monocytes Relative: 6

## 2010-12-07 LAB — POCT I-STAT CREATININE: Operator id: 257131

## 2010-12-22 ENCOUNTER — Emergency Department (HOSPITAL_COMMUNITY): Payer: BC Managed Care – PPO

## 2010-12-22 ENCOUNTER — Emergency Department (HOSPITAL_COMMUNITY)
Admission: EM | Admit: 2010-12-22 | Discharge: 2010-12-22 | Disposition: A | Discharge status: Home / Self Care | Payer: BC Managed Care – PPO | Attending: Emergency Medicine | Admitting: Emergency Medicine

## 2010-12-22 DIAGNOSIS — R05 Cough: Secondary | ICD-10-CM | POA: Insufficient documentation

## 2010-12-22 DIAGNOSIS — I10 Essential (primary) hypertension: Secondary | ICD-10-CM | POA: Insufficient documentation

## 2010-12-22 DIAGNOSIS — R490 Dysphonia: Secondary | ICD-10-CM | POA: Insufficient documentation

## 2010-12-22 DIAGNOSIS — R059 Cough, unspecified: Secondary | ICD-10-CM | POA: Insufficient documentation

## 2010-12-22 DIAGNOSIS — Z79899 Other long term (current) drug therapy: Secondary | ICD-10-CM | POA: Insufficient documentation

## 2011-05-23 ENCOUNTER — Telehealth: Payer: Self-pay | Admitting: Gastroenterology

## 2011-05-23 NOTE — Telephone Encounter (Signed)
Patient feels she has recurrent thrush and wants to come in and see Dr Russella Dar.  She reports dysphagia.  She reports her primary care MD has treated her with clortrimazole losenges.  She is frustrated.  She has been treated multiple times.  She denies any immunosuppressive meds.  Dr Russella Dar should she be seeing you?  Her primary care MD is a at a loss

## 2011-05-23 NOTE — Telephone Encounter (Signed)
No answer and no machine.  I will continue to try and call back

## 2011-05-23 NOTE — Telephone Encounter (Signed)
ENT or PCP would be better than GI for oral thrush We can evaluate dysphagia  She can be worked in with extender

## 2011-05-24 NOTE — Telephone Encounter (Signed)
Patient advised.  She will see her primary care MD.  She states she feels a little better this am after taking clortrimazole lozenges last night.  She says that her dysphagia is better this am.  She will call back if she wants to be evaluated for her dysphagia

## 2011-05-24 NOTE — Telephone Encounter (Signed)
Left message for patient to call back  

## 2011-12-05 ENCOUNTER — Encounter: Payer: Self-pay | Admitting: Gastroenterology

## 2013-10-18 ENCOUNTER — Telehealth: Payer: Self-pay | Admitting: Cardiology

## 2013-10-18 NOTE — Telephone Encounter (Signed)
Pt called in concerned about what is going on with her . She stated that she has been having irregular heartbeats, migraines, and it is causing her to have a lot of anxiety because of it. She like to be fit in sooner than 09/09 with Mickel Baas if possible. Please call  Thanks

## 2013-10-18 NOTE — Telephone Encounter (Signed)
Unable to reach pt or leave a message  

## 2013-10-23 NOTE — Telephone Encounter (Signed)
Spoke to patient.  patient seems very anxious about her condition. She states she did see Delia Chimes NP last week.  Ms fuller did do EKG and may want to do holter monitor but patient has not receive it yet.  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 She also states she started taking Rantidine 150 mg twice a day. She notice after eating palps develop  She also states she had an issue with MVP when she was younger,also she smokes. Patient states she has cut back to 1/2 of what she use to smoke.  RN informed patient we have appointment,10/25/13 at 11 am with [place] PA. Patient states she will take it.RN rescehdule appointment.

## 2013-10-25 ENCOUNTER — Encounter: Payer: Self-pay | Admitting: Cardiology

## 2013-10-25 ENCOUNTER — Ambulatory Visit (INDEPENDENT_AMBULATORY_CARE_PROVIDER_SITE_OTHER): Payer: BC Managed Care – PPO | Admitting: Physician Assistant

## 2013-10-25 ENCOUNTER — Encounter: Payer: Self-pay | Admitting: Physician Assistant

## 2013-10-25 VITALS — BP 112/78 | HR 69 | Ht 65.0 in | Wt 117.3 lb

## 2013-10-25 DIAGNOSIS — K219 Gastro-esophageal reflux disease without esophagitis: Secondary | ICD-10-CM

## 2013-10-25 DIAGNOSIS — F17200 Nicotine dependence, unspecified, uncomplicated: Secondary | ICD-10-CM

## 2013-10-25 DIAGNOSIS — R002 Palpitations: Secondary | ICD-10-CM | POA: Diagnosis not present

## 2013-10-25 DIAGNOSIS — R079 Chest pain, unspecified: Secondary | ICD-10-CM | POA: Diagnosis not present

## 2013-10-25 NOTE — Progress Notes (Signed)
Date:  10/25/2013   ID:  [name] [name], DOB Mar 15, 1965, MRN [id number]  PCP:  Pcp Not In System  Primary Cardiologist:  Hochrein     History of Present Illness: [name] [name] is a 48 y.o. female with a history of mitral valve prolapse, palpitations, GERD, viral syndrome, premenstrual dysphoria.  Patient presents today with complaints of her heart being "offbeat, fluttering". This occurs randomly she can feel it. It started 3 weeks ago. Happens 14-15 times per day. She sometimes feels dizzy but not much. She does drink approximately 3 cups of coffee in the morning and team during the day. She also reports chest pain radiating to her left arm and back. She states it feels "not comfortable". There some diaphoresis. No shortness of breath and it does not happen with exertion. She does continue to smoke less than a pack per day. Has been doing so since age 60. She drinks a glass of wineapproximately 2 times per year  The patient currently denies nausea, vomiting, fever, shortness of breath, orthopnea, PND, cough, congestion, abdominal pain, hematochezia, melena, lower extremity edema, claudication.  Blood pressure is well-controlled EKG shows sinus rhythm rate 69 beats per minutes possibly some H. or enlargement small Q waves inferiorly  Wt Readings from Last 3 Encounters:  10/25/13 117 lb 4.8 oz (53.207 kg)  03/30/10 124 lb 8 oz (56.473 kg)  02/15/10 123 lb (55.792 kg)     Past Medical History  Diagnosis Date  . GERD (gastroesophageal reflux disease)   . Migraine headache   . Viral syndrome   . Palpitations   . Osteoarthritis   . Premenstrual dysphoria     Current Outpatient Prescriptions  Medication Sig Dispense Refill  . atenolol (TENORMIN) 25 MG tablet Take 25 mg by mouth daily.      . ranitidine (ZANTAC) 150 MG tablet Take 150 mg by mouth 2 (two) times daily.       No current facility-administered medications for this visit.    Allergies:    Allergies  Allergen Reactions  .  Esomeprazole Magnesium   . Penicillins   . Sulfonamide Derivatives     Social History:  The patient  reports that she has been smoking Cigarettes.  She has a 19.5 pack-year smoking history. She does not have any smokeless tobacco history on file.   Family history:  No family history on file.  ROS:  Please see the history of present illness.  All other systems reviewed and negative.   PHYSICAL EXAM: VS:  BP 112/78  Pulse 69  Ht 5\' 5"  (1.651 m)  Wt 117 lb 4.8 oz (53.207 kg)  BMI 19.52 kg/m2 Well nourished, well developed, in no acute distress HEENT: Pupils are equal round react to light accommodation extraocular movements are intact.  Neck: no JVDNo cervical lymphadenopathy. Musculoskeletal: Very tender left of mid thoracic area Cardiac: Regular rate and rhythm without murmurs rubs or gallops. Lungs:  clear to auscultation bilaterally, no wheezing, rhonchi or rales Ext: no lower extremity edema.  2+ radial and 1+ dorsalis pedis pulses. Skin: warm and dry Neuro:  Grossly normal  EKG:  Normal sinus rhythm 69 beats per minute, possible H. or enlargement.   ASSESSMENT AND PLAN:  Problem List Items Addressed This Visit   NICOTINE ADDICTION     She has got nicotine patches. Counseled on smoking cessation.    G E R D     Currently taking ranitidine. No acid reflux symptoms noted.    Relevant  Medications      ranitidine (ZANTAC) 150 MG tablet   PALPITATIONS - Primary     Palpitations started approximately 3 weeks ago. Happening 14-15 times per day. We'll order today and monitor.  Blood pressure is currently well-controlled.    Relevant Orders      EKG 12-Lead      Cardiac event monitor   CHEST PAIN     Some typical and atypical features. Has not occurred with exertion. It does radiate to her back and her left arm. Does report some diaphoresis with it as well.  Risk factors include continued tobacco abuse. We'll check a treadmill Myoview.     Other Visit Diagnoses   Chest  pain, unspecified chest pain type        Relevant Orders       Myocardial Perfusion Imaging

## 2013-10-25 NOTE — Patient Instructions (Signed)
1.  Heart monitor 2.  Stress test. 3.  Follow up with Dr. Percival Spanish.

## 2013-10-25 NOTE — Assessment & Plan Note (Signed)
Currently taking ranitidine. No acid reflux symptoms noted.

## 2013-10-25 NOTE — Assessment & Plan Note (Signed)
She has got nicotine patches. Counseled on smoking cessation.

## 2013-10-25 NOTE — Assessment & Plan Note (Signed)
Some typical and atypical features. Has not occurred with exertion. It does radiate to her back and her left arm. Does report some diaphoresis with it as well.  Risk factors include continued tobacco abuse. We'll check a treadmill Myoview.

## 2013-10-25 NOTE — Addendum Note (Signed)
Addended by: Vennie Homans on: 10/25/2013 12:35 PM   Modules accepted: Orders

## 2013-10-25 NOTE — Assessment & Plan Note (Signed)
Palpitations started approximately 3 weeks ago. Happening 14-15 times per day. We'll order today and monitor.  Blood pressure is currently well-controlled.

## 2013-11-05 ENCOUNTER — Telehealth (HOSPITAL_COMMUNITY): Payer: Self-pay

## 2013-11-05 NOTE — Telephone Encounter (Signed)
Encounter complete. 

## 2013-11-06 ENCOUNTER — Ambulatory Visit: Payer: BC Managed Care – PPO | Admitting: Cardiology

## 2013-11-07 ENCOUNTER — Ambulatory Visit (HOSPITAL_COMMUNITY)
Admission: RE | Admit: 2013-11-07 | Discharge: 2013-11-07 | Disposition: A | Discharge status: Home / Self Care | Payer: BC Managed Care – PPO | Source: Ambulatory Visit | Attending: Cardiovascular Disease | Admitting: Cardiovascular Disease

## 2013-11-07 DIAGNOSIS — Z87891 Personal history of nicotine dependence: Secondary | ICD-10-CM | POA: Insufficient documentation

## 2013-11-07 DIAGNOSIS — R079 Chest pain, unspecified: Secondary | ICD-10-CM | POA: Insufficient documentation

## 2013-11-07 DIAGNOSIS — R0602 Shortness of breath: Secondary | ICD-10-CM | POA: Insufficient documentation

## 2013-11-07 DIAGNOSIS — R002 Palpitations: Secondary | ICD-10-CM | POA: Insufficient documentation

## 2013-11-07 DIAGNOSIS — M549 Dorsalgia, unspecified: Secondary | ICD-10-CM | POA: Diagnosis not present

## 2013-11-07 DIAGNOSIS — M79609 Pain in unspecified limb: Secondary | ICD-10-CM | POA: Insufficient documentation

## 2013-11-07 MED ORDER — TECHNETIUM TC 99M SESTAMIBI GENERIC - CARDIOLITE
10.9000 | Freq: Once | INTRAVENOUS | Status: AC | PRN
  Administered 2013-11-07: 10.9 via INTRAVENOUS

## 2013-11-07 MED ORDER — TECHNETIUM TC 99M SESTAMIBI GENERIC - CARDIOLITE
31.8000 | Freq: Once | INTRAVENOUS | Status: AC | PRN
  Administered 2013-11-07: 31.8 via INTRAVENOUS

## 2013-11-07 NOTE — Procedures (Addendum)
[place] [address] [place] [id number] [phone]  Cardiology Nuclear Med Study  [name] [name] is a 48 y.o. female     MRN : [id number]     DOB: Jun 12, 1965  Procedure Date: 11/07/2013  Nuclear Med Background Indication for Stress Test:  Evaluation for Ischemia History:  MVP and No prior respiratory history reported;No prior NUC MPI for comparison. Cardiac Risk Factors: Smoker  Symptoms:  Chest Pain, Dizziness, Palpitations and Left arm and back pain.   Nuclear Pre-Procedure Caffeine/Decaff Intake:  9:00pm NPO After: 7:00am   IV Site: R Forearm  IV 0.9% NS with Angio Cath:  22g  Chest Size (in):  n/a IV Started by: Rolene Course, RN  Height: 5\' 5"  (1.651 m)  Cup Size: A  BMI:  Body mass index is 19.47 kg/(m^2). Weight:  117 lb (53.071 kg)   Tech Comments:  n/a    Nuclear Med Study 1 or 2 day study: 1 day  Stress Test Type:  Stress  Order Authorizing Provider:  Minus Breeding, MD   Resting Radionuclide: Technetium 58m Sestamibi  Resting Radionuclide Dose: 10.9 mCi   Stress Radionuclide:  Technetium 14m Sestamibi  Stress Radionuclide Dose: 31.8 mCi           Stress Protocol Rest HR: 96 Stress HR: 160  Rest BP: 118/89 Stress BP: 143/82  Exercise Time (min): 8 METS: 10.1   Predicted Max HR: 174 bpm % Max HR: 91.95 bpm Rate Pressure Product: 22880  Dose of Adenosine (mg):  n/a Dose of Lexiscan: n/a mg  Dose of Atropine (mg): n/a Dose of Dobutamine: n/a mcg/kg/min (at max HR)  Stress Test Technologist: Leane Para, CCT Nuclear Technologist: Imagene Riches, CNMT   Rest Procedure:  Myocardial perfusion imaging was performed at rest 45 minutes following the intravenous administration of Technetium 78m Sestamibi. Stress Procedure:  The patient performed treadmill exercise using a Bruce  Protocol for 8 minutes. The patient stopped due to SOB and Leg discomfort and denied any chest pain.  There were no  significant ST-T wave changes.  Technetium 104m Sestamibi was injected IV at peak exercise and myocardial perfusion imaging was performed after a brief delay.  Transient Ischemic Dilatation (Normal <1.22):  1.11  QGS EDV:  78 ml QGS ESV:  36 ml LV Ejection Fraction: 54%    Rest ECG: NSR - Normal EKG  Stress ECG: No significant change from baseline ECG  QPS Raw Data Images:  Normal; no motion artifact; normal heart/lung ratio. Stress Images:  Normal homogeneous uptake in all areas of the myocardium. Rest Images:  Normal homogeneous uptake in all areas of the myocardium. Subtraction (SDS):  No evidence of ischemia. LV Wall Motion:  NL LV Function; NL Wall Motion  Impression Exercise Capacity:  Good exercise capacity. BP Response:  Normal blood pressure response. Clinical Symptoms:  No significant symptoms noted. ECG Impression:  No significant ST segment change suggestive of ischemia. Comparison with Prior Nuclear Study: No previous nuclear study performed   Overall Impression:  Normal stress nuclear study.   Sanda Klein, MD  11/07/2013 12:34 PM

## 2013-11-08 ENCOUNTER — Encounter: Payer: Self-pay | Admitting: *Deleted

## 2013-11-15 ENCOUNTER — Telehealth: Payer: Self-pay

## 2013-11-15 ENCOUNTER — Ambulatory Visit (INDEPENDENT_AMBULATORY_CARE_PROVIDER_SITE_OTHER): Payer: BC Managed Care – PPO | Admitting: Cardiology

## 2013-11-15 ENCOUNTER — Encounter: Payer: Self-pay | Admitting: Cardiology

## 2013-11-15 VITALS — BP 118/64 | HR 68 | Ht 64.0 in | Wt 117.0 lb

## 2013-11-15 DIAGNOSIS — R072 Precordial pain: Secondary | ICD-10-CM | POA: Insufficient documentation

## 2013-11-15 DIAGNOSIS — R002 Palpitations: Secondary | ICD-10-CM

## 2013-11-15 NOTE — Patient Instructions (Signed)
Your physician recommends that you schedule a follow-up appointment in: As needed  

## 2013-11-15 NOTE — Telephone Encounter (Signed)
Contacted the patient and she is scheduled for 11/20/13 10:45

## 2013-11-15 NOTE — Telephone Encounter (Signed)
Message copied by Marlon Pel on Fri Nov 15, 2013  4:15 PM ------      Message from: Lucio Edward T      Created: Fri Nov 15, 2013  3:05 PM       Maylon Cos, will do.      Geralynn Capri, can you help her get an appt with me or one of our APPs.            Thx,       MS                  ----- Message -----         From: Minus Breeding, MD         Sent: 11/15/2013  10:35 AM           To: Ladene Artist, MD            Norberto Sorenson,  Can you work this patient in anywhere as a new patient.  You have seen her in the distant past and she has chest pain not thought to be angina (negative stress test.)   Thanks.  Jake       ------

## 2013-11-15 NOTE — Progress Notes (Signed)
HPI The patient was seen here the other day because of palpitations and chest discomfort. She was sent for stress perfusion study which demonstrated an EF of 54% and no evidence of ischemia. She has worn a Holter monitor which demonstrated no arrhythmias.  She has been under quite a bit of stress. She started some Zantac and did have some improvement in her chest discomfort although it still occasionally hurts and aches. She tried to get into see a gastroenterologist or was told initially this might be 6 months. I have called to clarify this. She is looking for a new primary care doctor and I did give her a list of physicians who are accepting new patients. She has lots of pain meds including some back discomfort. She has some abdominal discomfort. She does have fluttering is better since she started Zantac. The discomfort might be slightly better.  Allergies  Allergen Reactions  . Esomeprazole Magnesium   . Penicillins   . Sulfonamide Derivatives     Current Outpatient Prescriptions  Medication Sig Dispense Refill  . atenolol (TENORMIN) 25 MG tablet Take 25 mg by mouth daily.      . ranitidine (ZANTAC) 150 MG tablet Take 150 mg by mouth 2 (two) times daily.       No current facility-administered medications for this visit.    Past Medical History  Diagnosis Date  . GERD (gastroesophageal reflux disease)   . Migraine headache   . Viral syndrome   . Palpitations   . Osteoarthritis   . Premenstrual dysphoria     Past Surgical History  Procedure Laterality Date  . Cesarean section    . Ovarian cyst surgery      ROS:  As stated in the HPI and negative for all other systems.  PHYSICAL EXAM BP 118/64  Pulse 68  Ht 5\' 4"  (1.626 m)  Wt 117 lb (53.071 kg)  BMI 20.07 kg/m2 GENERAL:  Well appearing HEENT:  Pupils equal round and reactive, fundi not visualized, oral mucosa unremarkable NECK:  No jugular venous distention, waveform within normal limits, carotid upstroke brisk and  symmetric, no bruits, no thyromegaly LYMPHATICS:  No cervical, inguinal adenopathy LUNGS:  Clear to auscultation bilaterally BACK:  No CVA tenderness CHEST:  Unremarkable HEART:  PMI not displaced or sustained,S1 and S2 within normal limits, no S3, no S4, no clicks, no rubs, no murmurs ABD:  Flat, positive bowel sounds normal in frequency in pitch, no bruits, no rebound, no guarding, no midline pulsatile mass, no hepatomegaly, no splenomegaly EXT:  2 plus pulses throughout, no edema, no cyanosis no clubbing SKIN:  No rashes no nodules NEURO:  Cranial nerves II through XII grossly intact, motor grossly intact throughout PSYCH:  Cognitively intact, oriented to person place and time   ASSESSMENT AND PLAN  CHEST PAIN:  Her chest pain has predominantly atypical features in that she has a negative stress perfusion study. I think she might have some GI discomfort. I sent a message to Dr. Fuller Plan see if the patient can be seen by GI. She can continue her over-the-counter therapies as well. I also his primary care physician's to establish with.  PALPITATIONS :    She had no arrhythmias on her Holter monitor and I reviewed this today.  At this point we discussed possibly taking a slightly increased dose of her atenolol 12.5 mg at night. She'll let me know if these worsen and we might need to pursue an event monitor.  TOBACCO:  We did  discuss the need to stop smoking.

## 2013-11-20 ENCOUNTER — Ambulatory Visit (INDEPENDENT_AMBULATORY_CARE_PROVIDER_SITE_OTHER): Payer: BC Managed Care – PPO | Admitting: Gastroenterology

## 2013-11-20 ENCOUNTER — Encounter: Payer: Self-pay | Admitting: Gastroenterology

## 2013-11-20 VITALS — BP 124/70 | HR 84 | Ht 63.0 in | Wt 117.5 lb

## 2013-11-20 DIAGNOSIS — R141 Gas pain: Secondary | ICD-10-CM

## 2013-11-20 DIAGNOSIS — F458 Other somatoform disorders: Secondary | ICD-10-CM

## 2013-11-20 DIAGNOSIS — R142 Eructation: Secondary | ICD-10-CM

## 2013-11-20 DIAGNOSIS — R0989 Other specified symptoms and signs involving the circulatory and respiratory systems: Secondary | ICD-10-CM

## 2013-11-20 DIAGNOSIS — R079 Chest pain, unspecified: Secondary | ICD-10-CM

## 2013-11-20 DIAGNOSIS — R140 Abdominal distension (gaseous): Secondary | ICD-10-CM

## 2013-11-20 DIAGNOSIS — R198 Other specified symptoms and signs involving the digestive system and abdomen: Secondary | ICD-10-CM

## 2013-11-20 DIAGNOSIS — R143 Flatulence: Secondary | ICD-10-CM

## 2013-11-20 MED ORDER — GLYCOPYRROLATE 1 MG PO TABS: 1.0000 mg | ORAL_TABLET | Freq: Two times a day (BID) | ORAL | Status: DC | PRN

## 2013-11-20 MED ORDER — OMEPRAZOLE 40 MG PO CPDR: 40.0000 mg | DELAYED_RELEASE_CAPSULE | Freq: Two times a day (BID) | ORAL | Status: DC

## 2013-11-20 NOTE — Patient Instructions (Signed)
We have sent the following medications to your pharmacy for you to pick up at your convenience: Omeprazole and Robinul.   Stop taking your Zantac.   Patient advised to avoid spicy, acidic, citrus, chocolate, mints, fruit and fruit juices.  Limit the intake of caffeine, alcohol and Soda.  Don't exercise too soon after eating.  Don't lie down within 3-4 hours of eating.  Elevate the head of your bed.  Thank you for choosing me and [facility].  Pricilla Riffle. Dagoberto Ligas., MD., Marval Regal  cc: Laurian Brim, MD

## 2013-11-20 NOTE — Progress Notes (Signed)
    History of Present Illness: This is a 48 year old female with a history of GERD. She is referred by Dr. Percival Spanish and she has multiple complaints. She complains of chest pain, palpitations and feels like her heart is fluttering. She has a sensation of something stuck in her throat for the past year. She has intermittent abdominal bloating, rumbling and gas which have worsened since she has been under more stress and starting ranitidine. She underwent evaluation by Dr. Warren Lacy including a nuclear stress test and Holter monitor that were negative. She states she has been under stress and feels her symptoms are clearly worsened with stress. Denies weight loss, abdominal pain, constipation, diarrhea, change in stool caliber, melena, hematochezia, nausea, vomiting, dysphagia.  Review of Systems: Pertinent positive and negative review of systems were noted in the above HPI section. All other review of systems were otherwise negative.  Current Medications, Allergies, Past Medical History, Past Surgical History, Family History and Social History were reviewed in [employer] [employer] record.  Physical Exam: General: Well developed , well nourished, no acute distress Head: Normocephalic and atraumatic Eyes:  sclerae anicteric, EOMI Ears: Normal auditory acuity Mouth: No deformity or lesions Neck: Supple, no masses or thyromegaly Lungs: Clear throughout to auscultation Heart: Regular rate and rhythm; no murmurs, rubs or bruits Abdomen: Soft, non tender and non distended. No masses, hepatosplenomegaly or hernias noted. Normal Bowel sounds Musculoskeletal: Symmetrical with no gross deformities  Skin: No lesions on visible extremities Pulses:  Normal pulses noted Extremities: No clubbing, cyanosis, edema or deformities noted Neurological: Alert oriented x 4, grossly nonfocal Cervical Nodes:  No significant cervical adenopathy Inguinal Nodes: No significant inguinal  adenopathy Psychological:  Alert and cooperative. Anxious.   Assessment and Recommendations:  1. Chest pain, palipations and sensation of fluttering in her chest. This does not appear to be gastrointestinal in origin. Possible stress, anxiety causing these symptoms. Followup with PCP.  2. Globus sensation could be GERD with LPR or anxiety related. A component of her chest pain might be GERD related. EGD in 2008 was normal. Begin omeprazole 40 mg twice daily and standard antireflux measures. DC ranitidine. REV if symptoms not controlled. Consider EGD if symptoms not controlled.   3. Possible irritable bowel syndrome with bloating, gas, rumbling. Begin glycopyrrolate 1 mg twice daily as needed.  4. Anxiety, stress. Followup with PCP.

## 2014-01-07 ENCOUNTER — Telehealth: Payer: Self-pay | Admitting: Gastroenterology

## 2014-01-07 NOTE — Telephone Encounter (Signed)
Left message for patient to call back  

## 2014-01-08 NOTE — Telephone Encounter (Signed)
Patient only took one day of omeparazole, she reported the same symptoms of "my heart fluttering and chest pain".  She returned to the ranitidine.  She is advised to take omeprazole BID for several days along with the ranitidine then try and d/c ranitidine as recommended.  She will try this this week.  She is advised to call back if her symptoms are not controlled on omeprazole BID alone.  She verbalized understanding.

## 2015-02-03 ENCOUNTER — Ambulatory Visit: Payer: Self-pay

## 2015-09-14 DIAGNOSIS — G43719 Chronic migraine without aura, intractable, without status migrainosus: Secondary | ICD-10-CM | POA: Diagnosis not present

## 2015-09-14 DIAGNOSIS — G43839 Menstrual migraine, intractable, without status migrainosus: Secondary | ICD-10-CM | POA: Diagnosis not present

## 2015-09-14 DIAGNOSIS — G43111 Migraine with aura, intractable, with status migrainosus: Secondary | ICD-10-CM | POA: Diagnosis not present

## 2015-09-14 DIAGNOSIS — G43019 Migraine without aura, intractable, without status migrainosus: Secondary | ICD-10-CM | POA: Diagnosis not present

## 2015-11-25 DIAGNOSIS — J209 Acute bronchitis, unspecified: Secondary | ICD-10-CM | POA: Diagnosis not present

## 2016-04-15 DIAGNOSIS — G514 Facial myokymia: Secondary | ICD-10-CM | POA: Diagnosis not present

## 2016-04-15 DIAGNOSIS — H01025 Squamous blepharitis left lower eyelid: Secondary | ICD-10-CM | POA: Diagnosis not present

## 2016-05-06 DIAGNOSIS — G43019 Migraine without aura, intractable, without status migrainosus: Secondary | ICD-10-CM | POA: Diagnosis not present

## 2016-05-06 DIAGNOSIS — G43719 Chronic migraine without aura, intractable, without status migrainosus: Secondary | ICD-10-CM | POA: Diagnosis not present

## 2016-05-06 DIAGNOSIS — G43839 Menstrual migraine, intractable, without status migrainosus: Secondary | ICD-10-CM | POA: Diagnosis not present

## 2016-05-06 DIAGNOSIS — G43111 Migraine with aura, intractable, with status migrainosus: Secondary | ICD-10-CM | POA: Diagnosis not present

## 2016-09-27 ENCOUNTER — Ambulatory Visit (INDEPENDENT_AMBULATORY_CARE_PROVIDER_SITE_OTHER)
Admission: RE | Admit: 2016-09-27 | Discharge: 2016-09-27 | Disposition: A | Discharge status: Home / Self Care | Payer: BLUE CROSS/BLUE SHIELD | Source: Ambulatory Visit | Attending: Primary Care | Admitting: Primary Care

## 2016-09-27 ENCOUNTER — Ambulatory Visit (INDEPENDENT_AMBULATORY_CARE_PROVIDER_SITE_OTHER): Payer: BLUE CROSS/BLUE SHIELD | Admitting: Primary Care

## 2016-09-27 ENCOUNTER — Encounter: Payer: Self-pay | Admitting: Primary Care

## 2016-09-27 DIAGNOSIS — M549 Dorsalgia, unspecified: Secondary | ICD-10-CM | POA: Diagnosis not present

## 2016-09-27 DIAGNOSIS — M542 Cervicalgia: Secondary | ICD-10-CM

## 2016-09-27 DIAGNOSIS — G8929 Other chronic pain: Secondary | ICD-10-CM | POA: Insufficient documentation

## 2016-09-27 DIAGNOSIS — G43709 Chronic migraine without aura, not intractable, without status migrainosus: Secondary | ICD-10-CM | POA: Diagnosis not present

## 2016-09-27 DIAGNOSIS — K219 Gastro-esophageal reflux disease without esophagitis: Secondary | ICD-10-CM | POA: Diagnosis not present

## 2016-09-27 DIAGNOSIS — M25512 Pain in left shoulder: Secondary | ICD-10-CM

## 2016-09-27 DIAGNOSIS — IMO0002 Reserved for concepts with insufficient information to code with codable children: Secondary | ICD-10-CM

## 2016-09-27 DIAGNOSIS — M25519 Pain in unspecified shoulder: Secondary | ICD-10-CM

## 2016-09-27 DIAGNOSIS — M19012 Primary osteoarthritis, left shoulder: Secondary | ICD-10-CM | POA: Diagnosis not present

## 2016-09-27 NOTE — Progress Notes (Signed)
Subjective:    Patient ID: [name] [name], female    DOB: 07-09-1965, 51 y.o.   MRN: [id number]  HPI  [name] [name] is a 51 year old female who presents today to establish care and discuss the problems mentioned below. Will obtain old records. Her last physical was years ago.  1) GERD: Previously managed on omeprazole 40 mg. Normal upper endoscopy in 2008. Current smoker of cigarettes. Not currently taking omeprazole.   2) Chronic Migraines: Currently managed on atenolol 25 mg. Currently following with the [facility] in [place]. Feels well managed.  3) IBS/Chronic Abdominal Pain: Evaluated by GI several times in prior years. Prescribed glycopyrrolate 1 mg to use PRN for IBS symptoms of bloating, gas, abdominal pain. She is currently not taking this. Overall doing well.   4) Chronic Neck Pain/ Acute on Chronic Shoulder Pain: History of right torn tendon and repair in 2016, used her left upper extremity often during this time. History of chronic pain to left shoulder for years, also with intermittent left facial and upper extremity numbness/tingling, symptoms overall manageable over the years. Three weeks ago noticing a "cathching" sensation to the left shoulder with radiation of pain down to her left elbow. Also with decrease in ROM. The sensation will occur with forward and lateral abduction. She doesn't take anything for her pain. She denies recent injury/trauma/heavy lifting.   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 She's done this for years. She denies slurred speech, facial drooping, arm drift, changes in speech.   Review of Systems  Constitutional: Negative for fatigue.  Respiratory: Negative for shortness of breath.   Cardiovascular: Negative for chest pain.  Musculoskeletal: Positive for arthralgias, back pain and neck pain.  Neurological: Positive for numbness. Negative for dizziness, speech difficulty, weakness and headaches.         Past Medical History:  Diagnosis Date  . GERD (gastroesophageal reflux disease)   . Migraine headache   . Osteoarthritis   . Palpitations   . Premenstrual dysphoria   . Viral syndrome      Social History   Social History  . Marital status: Married    Spouse name: N/A  . Number of children: 1  . Years of education: N/A   Occupational History  . FARMER Self Employed   Social History Main Topics  . Smoking status: Current Every Day Smoker    Packs/day: 0.75    Years: 26.00    Types: Cigarettes  . Smokeless tobacco: Never Used     Comment: pt given sheet  . Alcohol use Yes     Comment: Occassionally  . Drug use: No  . Sexual activity: Not on file   Other Topics Concern  . Not on file   Social History Narrative  . No narrative on file    Past Surgical History:  Procedure Laterality Date  . CESAREAN SECTION    . OVARIAN CYST SURGERY      Family History  Problem Relation Age of Onset  . Diabetes Brother   . Colon cancer Neg Hx   . Colon polyps Neg Hx   . Kidney disease Neg Hx     Allergies  Allergen Reactions  . Esomeprazole Magnesium   . Penicillins   . Sulfonamide Derivatives     Current Outpatient Prescriptions on File Prior to Visit  Medication Sig Dispense Refill  . Aspirin-Acetaminophen-Caffeine (EXCEDRIN MIGRAINE PO) Take 1 tablet by mouth as needed.    Marland Kitchen  atenolol (TENORMIN) 25 MG tablet Take 25 mg by mouth daily.    Marland Kitchen glycopyrrolate (ROBINUL) 1 MG tablet Take 1 tablet (1 mg total) by mouth 2 (two) times daily as needed. (Patient not taking: Reported on 09/27/2016) 60 tablet 11  . omeprazole (PRILOSEC) 40 MG capsule Take 1 capsule (40 mg total) by mouth 2 (two) times daily. (Patient not taking: Reported on 09/27/2016) 60 capsule 11   No current facility-administered medications on file prior to visit.     BP 122/72   Pulse 77   Temp (!) 97.3 F (36.3 C) (Oral)   Ht 5\' 3"  (1.6 m)   Wt 113 lb (51.3 kg)   LMP 09/10/2014   SpO2 98%    BMI 20.02 kg/m    Objective:   Physical Exam  Constitutional: She is oriented to person, place, and time. She appears well-nourished.  Neck: Neck supple.  Cardiovascular: Normal rate and regular rhythm.   Pulmonary/Chest: Effort normal and breath sounds normal.  Musculoskeletal:       Left shoulder: She exhibits decreased range of motion and pain. She exhibits no tenderness and no bony tenderness.       Cervical back: She exhibits pain. She exhibits normal range of motion and no tenderness.  Decrease in ROM to left shoulder with lateral, forward, and posterior abduction. Crepitus noted.  Neurological: She is alert and oriented to person, place, and time. She has normal reflexes.  Skin: Skin is warm and dry.  Psychiatric: She has a normal mood and affect.          Assessment & Plan:

## 2016-09-27 NOTE — Assessment & Plan Note (Signed)
Acute on chronic left shoulder pain, now with "cathcing" sensation. Will check plain films today. Consider PT. No obvious deformity on exam today.

## 2016-09-27 NOTE — Assessment & Plan Note (Addendum)
Chronic for years, with left sided facial and left upper extremity pain. Once saw a chiropractor. Check plain films today. Consider PT. Neuro exam otherwise unremarkable, no suspicion for CVA.

## 2016-09-27 NOTE — Assessment & Plan Note (Signed)
Occasional acid reflux, does not take omeprazole. Watches diet and tries to work on stress.

## 2016-09-27 NOTE — Assessment & Plan Note (Signed)
Overall doing well. Not taking glycopyrrolate.

## 2016-09-27 NOTE — Assessment & Plan Note (Signed)
Following with [facility] in [place]. Managed on atenolol daily, feels well managed.

## 2016-09-27 NOTE — Assessment & Plan Note (Signed)
From upper T-spine to lower L-spine. Overall manages well. Consider PT.

## 2016-09-27 NOTE — Patient Instructions (Signed)
Complete xray(s) prior to leaving today.   You may take Ibuprofen or tylenol as needed for pain.  I'll be in touch with you tomorrow once I receive your results.  Please schedule a physical with me within the next 3-6 months. You may also schedule a lab only appointment 3-4 days prior. We will discuss your lab results in detail during your physical.  It was a pleasure to meet you today! Please don't hesitate to call me with any questions. Welcome to [facility]!

## 2016-10-03 ENCOUNTER — Encounter: Payer: Self-pay | Admitting: *Deleted

## 2016-11-01 DIAGNOSIS — G43111 Migraine with aura, intractable, with status migrainosus: Secondary | ICD-10-CM | POA: Diagnosis not present

## 2016-11-01 DIAGNOSIS — G43019 Migraine without aura, intractable, without status migrainosus: Secondary | ICD-10-CM | POA: Diagnosis not present

## 2016-11-01 DIAGNOSIS — G43839 Menstrual migraine, intractable, without status migrainosus: Secondary | ICD-10-CM | POA: Diagnosis not present

## 2016-11-01 DIAGNOSIS — G43719 Chronic migraine without aura, intractable, without status migrainosus: Secondary | ICD-10-CM | POA: Diagnosis not present

## 2017-04-06 ENCOUNTER — Other Ambulatory Visit: Payer: Self-pay | Admitting: Primary Care

## 2017-04-06 DIAGNOSIS — Z0000 Encounter for general adult medical examination without abnormal findings: Secondary | ICD-10-CM

## 2017-04-10 ENCOUNTER — Ambulatory Visit (INDEPENDENT_AMBULATORY_CARE_PROVIDER_SITE_OTHER): Payer: BLUE CROSS/BLUE SHIELD | Admitting: Primary Care

## 2017-04-10 ENCOUNTER — Other Ambulatory Visit: Payer: BLUE CROSS/BLUE SHIELD

## 2017-04-10 ENCOUNTER — Encounter: Payer: Self-pay | Admitting: Primary Care

## 2017-04-10 ENCOUNTER — Other Ambulatory Visit (HOSPITAL_COMMUNITY)
Admission: RE | Admit: 2017-04-10 | Discharge: 2017-04-10 | Disposition: A | Discharge status: Home / Self Care | Payer: BLUE CROSS/BLUE SHIELD | Source: Ambulatory Visit | Attending: Primary Care | Admitting: Primary Care

## 2017-04-10 VITALS — BP 112/70 | HR 77 | Temp 98.0°F | Ht 63.0 in | Wt 117.8 lb

## 2017-04-10 DIAGNOSIS — Z1211 Encounter for screening for malignant neoplasm of colon: Secondary | ICD-10-CM | POA: Diagnosis not present

## 2017-04-10 DIAGNOSIS — Z124 Encounter for screening for malignant neoplasm of cervix: Secondary | ICD-10-CM | POA: Insufficient documentation

## 2017-04-10 DIAGNOSIS — K219 Gastro-esophageal reflux disease without esophagitis: Secondary | ICD-10-CM

## 2017-04-10 DIAGNOSIS — Z1239 Encounter for other screening for malignant neoplasm of breast: Secondary | ICD-10-CM

## 2017-04-10 DIAGNOSIS — Z0000 Encounter for general adult medical examination without abnormal findings: Secondary | ICD-10-CM | POA: Diagnosis not present

## 2017-04-10 DIAGNOSIS — G43709 Chronic migraine without aura, not intractable, without status migrainosus: Secondary | ICD-10-CM | POA: Diagnosis not present

## 2017-04-10 DIAGNOSIS — Z1231 Encounter for screening mammogram for malignant neoplasm of breast: Secondary | ICD-10-CM

## 2017-04-10 DIAGNOSIS — IMO0002 Reserved for concepts with insufficient information to code with codable children: Secondary | ICD-10-CM

## 2017-04-10 LAB — COMPREHENSIVE METABOLIC PANEL
ALBUMIN: 4.3 g/dL (ref 3.5–5.2)
ALT: 11 U/L (ref 0–35)
AST: 15 U/L (ref 0–37)
Alkaline Phosphatase: 65 U/L (ref 39–117)
BUN: 9 mg/dL (ref 6–23)
CALCIUM: 8.9 mg/dL (ref 8.4–10.5)
CHLORIDE: 103 meq/L (ref 96–112)
CO2: 30 mEq/L (ref 19–32)
CREATININE: 0.67 mg/dL (ref 0.40–1.20)
GFR: 99.11 mL/min (ref >60.00)
Glucose, Bld: 90 mg/dL (ref 70–99)
POTASSIUM: 3.6 meq/L (ref 3.5–5.1)
Sodium: 141 mEq/L (ref 135–145)
Total Bilirubin: 0.6 mg/dL (ref 0.2–1.2)
Total Protein: 7.1 g/dL (ref 6.0–8.3)

## 2017-04-10 LAB — LIPID PANEL
CHOLESTEROL: 151 mg/dL (ref 0–200)
HDL: 27.3 mg/dL — AB (ref >39.00)
LDL CALC: 106 mg/dL — AB (ref 0–99)
NonHDL: 123.98
TRIGLYCERIDES: 91 mg/dL (ref 0.0–149.0)
Total CHOL/HDL Ratio: 6
VLDL: 18.2 mg/dL (ref 0.0–40.0)

## 2017-04-10 MED ORDER — SUMATRIPTAN SUCCINATE 50 MG PO TABS: ORAL_TABLET | ORAL | 0 refills | Status: DC

## 2017-04-10 NOTE — Progress Notes (Signed)
Subjective:    Patient ID: [name] [name], female    DOB: May 10, 1965, 52 y.o.   MRN: [id number]  HPI  [name] [name] is a 52 year old female who presents today for complete physical. She has also noticed increased intensity of monthly migraines.   Immunizations: -Tetanus: Unsure, thinks it's been over 10 years -Influenza: Declines    Diet: She endorses a healthy diet.  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   Review of Systems  Constitutional: Negative for unexpected weight change.  HENT: Negative for rhinorrhea.   Respiratory: Negative for cough and shortness of breath.   Cardiovascular: Negative for chest pain.  Gastrointestinal: Negative for constipation and diarrhea.  Genitourinary: Negative for difficulty urinating and menstrual problem.  Musculoskeletal: Negative for arthralgias.       Muscle cramping to lower extremities including feet during migraines.   Skin: Negative for rash.  Allergic/Immunologic: Negative for environmental allergies.  Neurological: Positive for headaches. Negative for dizziness and numbness.       Past Medical History:  Diagnosis Date  . GERD (gastroesophageal reflux disease)   . Migraine headache   . Osteoarthritis   . Palpitations   . Premenstrual dysphoria   . Viral syndrome      Social History   Socioeconomic History  . Marital status: Married    Spouse name: Not on file  . Number of children: 1  . Years of education: Not on file  . Highest education level: Not on file  Social Needs  . Financial resource strain: Not on file    . Food insecurity - worry: Not on file  . Food insecurity - inability: Not on file  . Transportation needs - medical: Not on file  . Transportation needs - non-medical: Not on file  Occupational History  . Occupation: FARMER    Employer: SELF EMPLOYED  Tobacco Use  . Smoking status: Current Every Day Smoker    Packs/day: 0.75    Years: 26.00    Pack years: 19.50    Types: Cigarettes  . Smokeless tobacco: Never Used  . Tobacco comment: pt given sheet  Substance and Sexual Activity  . Alcohol use: Yes    Comment: Occassionally  . Drug use: No  . Sexual activity: Not on file  Other Topics Concern  . Not on file  Social History Narrative  . Not on file    Past Surgical History:  Procedure Laterality Date  . CESAREAN SECTION    . OVARIAN CYST SURGERY      Family History  Problem Relation Age of Onset  . Diabetes Brother   . Colon cancer Neg Hx   . Colon polyps Neg Hx   . Kidney disease Neg Hx     Allergies  Allergen Reactions  . Esomeprazole Magnesium   . Penicillins   . Sulfonamide Derivatives     Current Outpatient Medications on File Prior to Visit  Medication Sig Dispense Refill  . Aspirin-Acetaminophen-Caffeine (EXCEDRIN MIGRAINE PO) Take 1 tablet by mouth as needed.    Marland Kitchen atenolol (TENORMIN) 25 MG tablet Take 25 mg by mouth daily.    Marland Kitchen glycopyrrolate (ROBINUL) 1 MG tablet  Take 1 tablet (1 mg total) by mouth 2 (two) times daily as needed. (Patient not taking: Reported on 09/27/2016) 60 tablet 11  . omeprazole (PRILOSEC) 40 MG capsule Take 1 capsule (40 mg total) by mouth 2 (two) times daily. (Patient not taking: Reported on 09/27/2016) 60 capsule 11   No current facility-administered medications on file prior to visit.     BP 112/70   Pulse 77   Temp 98 F (36.7 C) (Oral)   Ht 5\' 3"  (1.6 m)   Wt 117 lb 12 oz (53.4 kg)   LMP 09/10/2014   SpO2 98%   BMI 20.86 kg/m    Objective:   Physical Exam  Constitutional: She is oriented to person, place, and  time. She appears well-nourished.  HENT:  Right Ear: Tympanic membrane and ear canal normal.  Left Ear: Tympanic membrane and ear canal normal.  Nose: Nose normal.  Mouth/Throat: Oropharynx is clear and moist.  Eyes: Conjunctivae and EOM are normal. Pupils are equal, round, and reactive to light.  Neck: Neck supple. No thyromegaly present.  Cardiovascular: Normal rate and regular rhythm.  No murmur heard. Pulmonary/Chest: Effort normal and breath sounds normal. She has no rales.  Abdominal: Soft. Bowel sounds are normal. There is no tenderness.  Musculoskeletal: Normal range of motion.  Lymphadenopathy:    She has no cervical adenopathy.  Neurological: She is alert and oriented to person, place, and time. She has normal reflexes. No cranial nerve deficit.  Skin: Skin is warm and dry. No rash noted.  Psychiatric: She has a normal mood and affect.          Assessment & Plan:

## 2017-04-10 NOTE — Assessment & Plan Note (Addendum)
Not using omeprazole since the new year after she stopped drinking instant coffee.  Denies GERD symptoms.  Continue off omeprazole.

## 2017-04-10 NOTE — Patient Instructions (Signed)
Stop by the lab prior to leaving today. I will notify you of your results once received.   We will notify you once your pap results have resulted.  You will be contacted regarding your referral to GI for the colonoscopy.  Please let us know if you have not been contacted within one week.   Call the [facility] to schedule your mammogram.  Start exercising. You should be getting 150 minutes of moderate intensity exercise weekly.  Ensure you are consuming 64 ounces of water daily.  Use the Imitrex as needed for severe migraines. Take 1 tablet by mouth at migraine onset. May repeat in 2 hours if migraine persists. Do not exceed 2 tablets in 24 hours.  It was a pleasure to see you today!   Preventive Care 40-64 Years, Female Preventive care refers to lifestyle choices and visits with your health care provider that can promote health and wellness. What does preventive care include?  A yearly physical exam. This is also called an annual well check.  Dental exams once or twice a year.  Routine eye exams. Ask your health care provider how often you should have your eyes checked.  Personal lifestyle choices, including: ? Daily care of your teeth and gums. ? Regular physical activity. ? Eating a healthy diet. ? Avoiding tobacco and drug use. ? Limiting alcohol use. ? Practicing safe sex. ? Taking low-dose aspirin daily starting at age 71. ? Taking vitamin and mineral supplements as recommended by your health care provider. What happens during an annual well check? The services and screenings done by your health care provider during your annual well check will depend on your age, overall health, lifestyle risk factors, and family history of disease. Counseling Your health care provider may ask you questions about your:  Alcohol use.  Tobacco use.  Drug use.  Emotional well-being.  Home and relationship well-being.  Sexual activity.  Eating habits.  Work and work  Statistician.  Method of birth control.  Menstrual cycle.  Pregnancy history.  Screening You may have the following tests or measurements:  Height, weight, and BMI.  Blood pressure.  Lipid and cholesterol levels. These may be checked every 5 years, or more frequently if you are over 67 years old.  Skin check.  Lung cancer screening. You may have this screening every year starting at age 1 if you have a 30-pack-year history of smoking and currently smoke or have quit within the past 15 years.  Fecal occult blood test (FOBT) of the stool. You may have this test every year starting at age 58.  Flexible sigmoidoscopy or colonoscopy. You may have a sigmoidoscopy every 5 years or a colonoscopy every 10 years starting at age 90.  Hepatitis C blood test.  Hepatitis B blood test.  Sexually transmitted disease (STD) testing.  Diabetes screening. This is done by checking your blood sugar (glucose) after you have not eaten for a while (fasting). You may have this done every 1-3 years.  Mammogram. This may be done every 1-2 years. Talk to your health care provider about when you should start having regular mammograms. This may depend on whether you have a family history of breast cancer.  BRCA-related cancer screening. This may be done if you have a family history of breast, ovarian, tubal, or peritoneal cancers.  Pelvic exam and Pap test. This may be done every 3 years starting at age 42. Starting at age 72, this may be done every 5 years if you  have a Pap test in combination with an HPV test.  Bone density scan. This is done to screen for osteoporosis. You may have this scan if you are at high risk for osteoporosis.  Discuss your test results, treatment options, and if necessary, the need for more tests with your health care provider. Vaccines Your health care provider may recommend certain vaccines, such as:  Influenza vaccine. This is recommended every year.  Tetanus,  diphtheria, and acellular pertussis (Tdap, Td) vaccine. You may need a Td booster every 10 years.  Varicella vaccine. You may need this if you have not been vaccinated.  Zoster vaccine. You may need this after age 59.  Measles, mumps, and rubella (MMR) vaccine. You may need at least one dose of MMR if you were born in 1957 or later. You may also need a second dose.  Pneumococcal 13-valent conjugate (PCV13) vaccine. You may need this if you have certain conditions and were not previously vaccinated.  Pneumococcal polysaccharide (PPSV23) vaccine. You may need one or two doses if you smoke cigarettes or if you have certain conditions.  Meningococcal vaccine. You may need this if you have certain conditions.  Hepatitis A vaccine. You may need this if you have certain conditions or if you travel or work in places where you may be exposed to hepatitis A.  Hepatitis B vaccine. You may need this if you have certain conditions or if you travel or work in places where you may be exposed to hepatitis B.  Haemophilus influenzae type b (Hib) vaccine. You may need this if you have certain conditions.  Talk to your health care provider about which screenings and vaccines you need and how often you need them. This information is not intended to replace advice given to you by your health care provider. Make sure you discuss any questions you have with your health care provider. Document Released: 03/13/2015 Document Revised: 11/04/2015 Document Reviewed: 12/16/2014 Elsevier Interactive Patient Education  [employer].

## 2017-04-10 NOTE — Assessment & Plan Note (Signed)
Td due, she will think about this. Declines influenza vaccination.  Pap smear due, completed today. Mammogram due, pending. Colonoscopy due this year, referral placed. Increase vegetables, fruit, whole grains. Exam unremarkable. Labs pending. Follow up in 1 year.

## 2017-04-10 NOTE — Assessment & Plan Note (Addendum)
Continues and will experience once monthly on average with menstrual cycles. Sometimes will experience severe migraines which last 4 days. She's noticed left sided eye/muscle twitching.   Overall doing well on Excedrin Migraine except for severe migraines. Rx for Imitrex provided to use PRN for severe migraines. She will update.

## 2017-04-12 LAB — CYTOLOGY - PAP
Diagnosis: NEGATIVE
HPV (WINDOPATH): NOT DETECTED

## 2017-05-17 DIAGNOSIS — G43719 Chronic migraine without aura, intractable, without status migrainosus: Secondary | ICD-10-CM | POA: Diagnosis not present

## 2017-05-17 DIAGNOSIS — G43839 Menstrual migraine, intractable, without status migrainosus: Secondary | ICD-10-CM | POA: Diagnosis not present

## 2017-05-17 DIAGNOSIS — G43111 Migraine with aura, intractable, with status migrainosus: Secondary | ICD-10-CM | POA: Diagnosis not present

## 2017-05-17 DIAGNOSIS — G43019 Migraine without aura, intractable, without status migrainosus: Secondary | ICD-10-CM | POA: Diagnosis not present

## 2017-06-01 ENCOUNTER — Ambulatory Visit: Payer: BLUE CROSS/BLUE SHIELD | Admitting: Family Medicine

## 2017-06-01 ENCOUNTER — Encounter: Payer: Self-pay | Admitting: Family Medicine

## 2017-06-01 VITALS — BP 106/64 | HR 79 | Temp 98.3°F | Ht 63.0 in | Wt 117.5 lb

## 2017-06-01 DIAGNOSIS — R339 Retention of urine, unspecified: Secondary | ICD-10-CM | POA: Diagnosis not present

## 2017-06-01 DIAGNOSIS — N3000 Acute cystitis without hematuria: Secondary | ICD-10-CM | POA: Diagnosis not present

## 2017-06-01 LAB — POCT URINALYSIS DIPSTICK
Bilirubin, UA: NEGATIVE
Blood, UA: NEGATIVE
GLUCOSE UA: NEGATIVE
Ketones, UA: NEGATIVE
Nitrite, UA: NEGATIVE
Protein, UA: NEGATIVE
Urobilinogen, UA: 0.2 E.U./dL
pH, UA: 6 (ref 5.0–8.0)

## 2017-06-01 MED ORDER — NITROFURANTOIN MONOHYD MACRO 100 MG PO CAPS: 100.0000 mg | ORAL_CAPSULE | Freq: Two times a day (BID) | ORAL | 0 refills | Status: DC

## 2017-06-01 NOTE — Progress Notes (Signed)
Subjective:    Patient ID: [name] [name], female    DOB: September 13, 1965, 52 y.o.   MRN: [id number]  HPI 52 yo pt of NP Clark here with urinary symptoms Started 2 d ago  Dark urine -does not always drink enough fluids  Urinary retention -had to strain to go  Dysuria- starting to burn with urination  Low abd pain -rad to low back   No urine odor  No hematuria   No chance pregnant at all   In the middle of 2 wk long menses/ she is perimenopausal   Wt Readings from Last 3 Encounters:  06/01/17 117 lb 8 oz (53.3 kg)  04/10/17 117 lb 12 oz (53.4 kg)  09/27/16 113 lb (51.3 kg)   Temp: 98.3 F (36.8 C)    Current smoker   UA- trace leukocytes  Results for orders placed or performed in visit on 06/01/17  Urinalysis Dipstick  Result Value Ref Range   Color, UA Straw    Clarity, UA Clear    Glucose, UA Negative    Bilirubin, UA Negative    Ketones, UA Negative    Spec Grav, UA <=1.005 (A) 1.010 - 1.025   Blood, UA Negative    pH, UA 6.0 5.0 - 8.0   Protein, UA Negative    Urobilinogen, UA 0.2 0.2 or 1.0 E.U./dL   Nitrite, UA Negative    Leukocytes, UA Trace (A) Negative   Appearance Clear    Odor None      Patient Active Problem List   Diagnosis Date Noted  . Preventative health care 04/10/2017  . Chronic back pain 09/27/2016  . Chronic shoulder pain 09/27/2016  . Chronic neck pain 09/27/2016  . ABDOMINAL PAIN-MULTIPLE SITES 02/15/2010  . NICOTINE ADDICTION 07/31/2009  . Chronic migraine 03/22/2007  . MITRAL VALVE PROLAPSE 03/22/2007  . G E R D 03/22/2007   Past Medical History:  Diagnosis Date  . GERD (gastroesophageal reflux disease)   . Migraine headache   . Osteoarthritis   . Palpitations   . Premenstrual dysphoria   . Viral syndrome    Past Surgical History:  Procedure Laterality Date  . CESAREAN SECTION    . OVARIAN CYST SURGERY     Social History   Tobacco Use  . Smoking status: Current Every Day Smoker    Packs/day: 0.75    Years: 26.00   Pack years: 19.50    Types: Cigarettes  . Smokeless tobacco: Never Used  . Tobacco comment: pt given sheet  Substance Use Topics  . Alcohol use: Yes    Comment: Occassionally  . Drug use: No   Family History  Problem Relation Age of Onset  . Diabetes Brother   . Colon cancer Neg Hx   . Colon polyps Neg Hx   . Kidney disease Neg Hx    Allergies  Allergen Reactions  . Esomeprazole Magnesium   . Penicillins   . Sulfonamide Derivatives    Current Outpatient Medications on File Prior to Visit  Medication Sig Dispense Refill  . Aspirin-Acetaminophen-Caffeine (EXCEDRIN MIGRAINE PO) Take 1 tablet by mouth as needed.    Marland Kitchen atenolol (TENORMIN) 25 MG tablet Take 25 mg by mouth daily.    Marland Kitchen glycopyrrolate (ROBINUL) 1 MG tablet Take 1 tablet (1 mg total) by mouth 2 (two) times daily as needed. 60 tablet 11  . SUMAtriptan (IMITREX) 50 MG tablet Take 1 tablet by mouth at migraine onset. May repeat in 2 hours if headache persists or recurs.  Do not exceed 2 tablets in 24 hours. 10 tablet 0   No current facility-administered medications on file prior to visit.     Review of Systems  Constitutional: Positive for fatigue. Negative for activity change, appetite change and fever.  HENT: Negative for congestion and sore throat.   Eyes: Negative for itching and visual disturbance.  Respiratory: Negative for cough and shortness of breath.   Cardiovascular: Negative for leg swelling.  Gastrointestinal: Negative for abdominal distention, abdominal pain, constipation, diarrhea and nausea.  Endocrine: Negative for cold intolerance and polydipsia.  Genitourinary: Positive for dysuria, frequency, menstrual problem and urgency. Negative for decreased urine volume, difficulty urinating, flank pain, hematuria and vaginal discharge.  Musculoskeletal: Negative for myalgias.  Skin: Negative for rash.  Allergic/Immunologic: Negative for immunocompromised state.  Neurological: Negative for dizziness and weakness.    Hematological: Negative for adenopathy.       Objective:   Physical Exam  Constitutional: She appears well-developed and well-nourished. No distress.  Well appearing   HENT:  Head: Normocephalic and atraumatic.  Eyes: Pupils are equal, round, and reactive to light. Conjunctivae and EOM are normal.  Neck: Normal range of motion. Neck supple.  Cardiovascular: Normal rate, regular rhythm and normal heart sounds.  Pulmonary/Chest: Effort normal and breath sounds normal. No respiratory distress. She has no wheezes.  Diffusely distant bs   Abdominal: Soft. Bowel sounds are normal. She exhibits no distension. There is tenderness. There is no rebound.  No cva tenderness  Mild suprapubic tenderness  Musculoskeletal: She exhibits no edema.  Lymphadenopathy:    She has no cervical adenopathy.  Neurological: She is alert.  Skin: No rash noted. No pallor.  Psychiatric: She has a normal mood and affect.          Assessment & Plan:   Problem List Items Addressed This Visit      Genitourinary   Acute cystitis - Primary    Symptomatic / with tr leuk on UA (but it is very dilute from big water intake this am)  Cover with macrobid  cx pending  Handout given  Avoid bladder irritants  Update if worse or not improving       Relevant Orders   Urine Culture    Other Visit Diagnoses    Urinary retention       Relevant Orders   Urinalysis Dipstick (Completed)

## 2017-06-01 NOTE — Patient Instructions (Addendum)
Keep drinking water  Take the generic macrobid as directed for uti  Alert [place] if symptoms worsen   We will contact you when the culture result returns     Urinary Tract Infection, Adult A urinary tract infection (UTI) is an infection of any part of the urinary tract, which includes the kidneys, ureters, bladder, and urethra. These organs make, store, and get rid of urine in the body. UTI can be a bladder infection (cystitis) or kidney infection (pyelonephritis). What are the causes? This infection may be caused by fungi, viruses, or bacteria. Bacteria are the most common cause of UTIs. This condition can also be caused by repeated incomplete emptying of the bladder during urination. What increases the risk? This condition is more likely to develop if:  You ignore your need to urinate or hold urine for long periods of time.  You do not empty your bladder completely during urination.  You wipe back to front after urinating or having a bowel movement, if you are female.  You are uncircumcised, if you are female.  You are constipated.  You have a urinary catheter that stays in place (indwelling).  You have a weak defense (immune) system.  You have a medical condition that affects your bowels, kidneys, or bladder.  You have diabetes.  You take antibiotic medicines frequently or for long periods of time, and the antibiotics no longer work well against certain types of infections (antibiotic resistance).  You take medicines that irritate your urinary tract.  You are exposed to chemicals that irritate your urinary tract.  You are female.  What are the signs or symptoms? Symptoms of this condition include:  Fever.  Frequent urination or passing small amounts of urine frequently.  Needing to urinate urgently.  Pain or burning with urination.  Urine that smells bad or unusual.  Cloudy urine.  Pain in the lower abdomen or back.  Trouble urinating.  Blood in the  urine.  Vomiting or being less hungry than normal.  Diarrhea or abdominal pain.  Vaginal discharge, if you are female.  How is this diagnosed? This condition is diagnosed with a medical history and physical exam. You will also need to provide a urine sample to test your urine. Other tests may be done, including:  Blood tests.  Sexually transmitted disease (STD) testing.  If you have had more than one UTI, a cystoscopy or imaging studies may be done to determine the cause of the infections. How is this treated? Treatment for this condition often includes a combination of two or more of the following:  Antibiotic medicine.  Other medicines to treat less common causes of UTI.  Over-the-counter medicines to treat pain.  Drinking enough water to stay hydrated.  Follow these instructions at home:  Take over-the-counter and prescription medicines only as told by your health care provider.  If you were prescribed an antibiotic, take it as told by your health care provider. Do not stop taking the antibiotic even if you start to feel better.  Avoid alcohol, caffeine, tea, and carbonated beverages. They can irritate your bladder.  Drink enough fluid to keep your urine clear or pale yellow.  Keep all follow-up visits as told by your health care provider. This is important.  Make sure to: ? Empty your bladder often and completely. Do not hold urine for long periods of time. ? Empty your bladder before and after sex. ? Wipe from front to back after a bowel movement if you are female. Use each  tissue one time when you wipe. Contact a health care provider if:  You have back pain.  You have a fever.  You feel nauseous or vomit.  Your symptoms do not get better after 3 days.  Your symptoms go away and then return. Get help right away if:  You have severe back pain or lower abdominal pain.  You are vomiting and cannot keep down any medicines or water. This information is not  intended to replace advice given to you by your health care provider. Make sure you discuss any questions you have with your health care provider. Document Released: 11/24/2004 Document Revised: 07/29/2015 Document Reviewed: 01/05/2015 Elsevier Interactive Patient Education  [employer].

## 2017-06-01 NOTE — Assessment & Plan Note (Signed)
Symptomatic / with tr leuk on UA (but it is very dilute from big water intake this am)  Cover with macrobid  cx pending  Handout given  Avoid bladder irritants  Update if worse or not improving

## 2017-06-02 LAB — URINE CULTURE
MICRO NUMBER: 90417870
RESULT: NO GROWTH
SPECIMEN QUALITY:: ADEQUATE

## 2017-06-13 ENCOUNTER — Encounter: Payer: Self-pay | Admitting: Primary Care

## 2017-07-28 ENCOUNTER — Encounter: Payer: Self-pay | Admitting: Primary Care

## 2017-07-28 ENCOUNTER — Ambulatory Visit: Payer: BLUE CROSS/BLUE SHIELD | Admitting: Primary Care

## 2017-07-28 VITALS — BP 112/70 | HR 81 | Temp 98.3°F | Ht 63.0 in | Wt 116.5 lb

## 2017-07-28 DIAGNOSIS — J019 Acute sinusitis, unspecified: Secondary | ICD-10-CM | POA: Diagnosis not present

## 2017-07-28 MED ORDER — AZITHROMYCIN 250 MG PO TABS: ORAL_TABLET | ORAL | 0 refills | Status: DC

## 2017-07-28 MED ORDER — FLUTICASONE PROPIONATE 50 MCG/ACT NA SUSP: 1.0000 | Freq: Two times a day (BID) | NASAL | 0 refills | Status: DC | PRN

## 2017-07-28 NOTE — Progress Notes (Signed)
Subjective:    Patient ID: [name] [name], female    DOB: 1966-02-16, 52 y.o.   MRN: [id number]  HPI  Ms. Scully is a 52 year old female with a history of chronic migraines, sinusitis, and GERD who presents today with a chief complaint of sinus pressure.  She also reports ear pain, cough, headache, chest congestion, fatgue. Her symptoms began one week ago. She was around his son who was sick with similar symptoms, he recovered quickly. She has noticed chills, is not running fevers. She's taken Alka-Selzer Cold once without much improvement. Overall her sinus pressure is worse.   Review of Systems  Constitutional: Positive for fatigue. Negative for chills and fever.  HENT: Positive for congestion, sinus pressure and sinus pain.   Respiratory: Positive for cough.        Past Medical History:  Diagnosis Date  . GERD (gastroesophageal reflux disease)   . Migraine headache   . Osteoarthritis   . Palpitations   . Premenstrual dysphoria   . Viral syndrome      Social History   Socioeconomic History  . Marital status: Married    Spouse name: Not on file  . Number of children: 1  . Years of education: Not on file  . Highest education level: Not on file  Occupational History  . Occupation: FARMER    Employer: SELF EMPLOYED  Social Needs  . Financial resource strain: Not on file  . Food insecurity:    Worry: Not on file    Inability: Not on file  . Transportation needs:    Medical: Not on file    Non-medical: Not on file  Tobacco Use  . Smoking status: Current Every Day Smoker    Packs/day: 0.75    Years: 26.00    Pack years: 19.50    Types: Cigarettes  . Smokeless tobacco: Never Used  . Tobacco comment: pt given sheet  Substance and Sexual Activity  . Alcohol use: Yes    Comment: Occassionally  . Drug use: No  . Sexual activity: Not on file  Lifestyle  . Physical activity:    Days per week: Not on file    Minutes per session: Not on file  . Stress: Not on file    Relationships  . Social connections:    Talks on phone: Not on file    Gets together: Not on file    Attends religious service: Not on file    Active member of club or organization: Not on file    Attends meetings of clubs or organizations: Not on file    Relationship status: Not on file  . Intimate partner violence:    Fear of current or ex partner: Not on file    Emotionally abused: Not on file    Physically abused: Not on file    Forced sexual activity: Not on file  Other Topics Concern  . Not on file  Social History Narrative  . Not on file    Past Surgical History:  Procedure Laterality Date  . CESAREAN SECTION    . OVARIAN CYST SURGERY      Family History  Problem Relation Age of Onset  . Diabetes Brother   . Colon cancer Neg Hx   . Colon polyps Neg Hx   . Kidney disease Neg Hx     Allergies  Allergen Reactions  . Esomeprazole Magnesium   . Penicillins   . Sulfonamide Derivatives     Current Outpatient Medications on  File Prior to Visit  Medication Sig Dispense Refill  . Aspirin-Acetaminophen-Caffeine (EXCEDRIN MIGRAINE PO) Take 1 tablet by mouth as needed.    Marland Kitchen atenolol (TENORMIN) 25 MG tablet Take 25 mg by mouth daily.    Marland Kitchen glycopyrrolate (ROBINUL) 1 MG tablet Take 1 tablet (1 mg total) by mouth 2 (two) times daily as needed. 60 tablet 11  . nitrofurantoin, macrocrystal-monohydrate, (MACROBID) 100 MG capsule Take 1 capsule (100 mg total) by mouth 2 (two) times daily. 14 capsule 0  . SUMAtriptan (IMITREX) 50 MG tablet Take 1 tablet by mouth at migraine onset. May repeat in 2 hours if headache persists or recurs. Do not exceed 2 tablets in 24 hours. (Patient not taking: Reported on 07/28/2017) 10 tablet 0   No current facility-administered medications on file prior to visit.     BP 112/70   Pulse 81   Temp 98.3 F (36.8 C) (Oral)   Ht 5\' 3"  (1.6 m)   Wt 116 lb 8 oz (52.8 kg)   LMP 09/10/2014   SpO2 97%   BMI 20.64 kg/m    Objective:   Physical  Exam  Constitutional: She appears well-nourished. She does not appear ill.  HENT:  Right Ear: Tympanic membrane and ear canal normal.  Left Ear: Tympanic membrane and ear canal normal.  Nose: Mucosal edema present. Right sinus exhibits maxillary sinus tenderness. Right sinus exhibits no frontal sinus tenderness. Left sinus exhibits maxillary sinus tenderness. Left sinus exhibits no frontal sinus tenderness.  Mouth/Throat: Oropharynx is clear and moist.  Neck: Neck supple.  Cardiovascular: Normal rate and regular rhythm.  Respiratory: Effort normal and breath sounds normal. She has no wheezes.  Skin: Skin is warm and dry.           Assessment & Plan:  Sinusitis:  Sinus pressure, cough, congestion x 7 days, feeling worse now. Exam today with clear lungs, sinus tenderness on exam. Could still be viral, but given presentation and increased sinus pressure will treat. Rx for Zpak sent to pharmacy. PCN allergy. Discussed use of Flonase, Delsym PRN. Follow up PRN.  Pleas Koch, NP

## 2017-07-28 NOTE — Patient Instructions (Signed)
Start Azithromycin antibiotics for infection. Take 2 tablets by mouth today, then 1 tablet daily for 4 additional days.  Nasal Congestion/Ear Pressure: Try using Flonase (fluticasone) nasal spray. Instill 1 spray in each nostril twice daily.   You can take Delsym or Robitussin as needed for cough.  Makes sure to stay hydrated with water.  It was a pleasure to see you today!

## 2017-08-15 ENCOUNTER — Ambulatory Visit: Payer: BLUE CROSS/BLUE SHIELD | Admitting: Primary Care

## 2017-08-15 ENCOUNTER — Encounter: Payer: Self-pay | Admitting: Primary Care

## 2017-08-15 VITALS — BP 112/74 | HR 91 | Temp 98.2°F | Ht 63.0 in | Wt 114.8 lb

## 2017-08-15 DIAGNOSIS — B370 Candidal stomatitis: Secondary | ICD-10-CM

## 2017-08-15 MED ORDER — CLOTRIMAZOLE 10 MG MT TROC: 10.0000 mg | Freq: Four times a day (QID) | OROMUCOSAL | 0 refills | Status: DC

## 2017-08-15 NOTE — Progress Notes (Signed)
Subjective:    Patient ID: [name] [name], female    DOB: May 10, 1965, 52 y.o.   MRN: [id number]  HPI  Ms. Tenpenny is a 52 year old female with a history of tobacco abuse who presents today with a chief complaint of "thursh".   She has a history of thrush that occurs after each antibiotic use. She does have a history of vaginal yeast infections after antibiotics which hasn't been present since the birth of her last child. She's noticed symptoms of stinging and "raw" mouth that feel typical to her prior episodes.   She's been treated with antibiotics on May 31st (Azithromycin) and again on June 9th (Clindamycin). During episodes of thrush she was typically treated with clotrimazole troches four times daily with resolve.   Review of Systems  Constitutional: Negative for fever.  HENT:       Oral thrush, burning to tongue  Respiratory:       Improved cough from recent bronchitis       Past Medical History:  Diagnosis Date  . GERD (gastroesophageal reflux disease)   . Migraine headache   . Osteoarthritis   . Palpitations   . Premenstrual dysphoria   . Viral syndrome      Social History   Socioeconomic History  . Marital status: Married    Spouse name: Not on file  . Number of children: 1  . Years of education: Not on file  . Highest education level: Not on file  Occupational History  . Occupation: FARMER    Employer: SELF EMPLOYED  Social Needs  . Financial resource strain: Not on file  . Food insecurity:    Worry: Not on file    Inability: Not on file  . Transportation needs:    Medical: Not on file    Non-medical: Not on file  Tobacco Use  . Smoking status: Current Every Day Smoker    Packs/day: 0.75    Years: 26.00    Pack years: 19.50    Types: Cigarettes  . Smokeless tobacco: Never Used  . Tobacco comment: pt given sheet  Substance and Sexual Activity  . Alcohol use: Yes    Comment: Occassionally  . Drug use: No  . Sexual activity: Not on file  Lifestyle  .  Physical activity:    Days per week: Not on file    Minutes per session: Not on file  . Stress: Not on file  Relationships  . Social connections:    Talks on phone: Not on file    Gets together: Not on file    Attends religious service: Not on file    Active member of club or organization: Not on file    Attends meetings of clubs or organizations: Not on file    Relationship status: Not on file  . Intimate partner violence:    Fear of current or ex partner: Not on file    Emotionally abused: Not on file    Physically abused: Not on file    Forced sexual activity: Not on file  Other Topics Concern  . Not on file  Social History Narrative  . Not on file    Past Surgical History:  Procedure Laterality Date  . CESAREAN SECTION    . OVARIAN CYST SURGERY      Family History  Problem Relation Age of Onset  . Diabetes Brother   . Colon cancer Neg Hx   . Colon polyps Neg Hx   . Kidney disease Neg  Hx     Allergies  Allergen Reactions  . Esomeprazole Magnesium   . Penicillins   . Sulfonamide Derivatives     Current Outpatient Medications on File Prior to Visit  Medication Sig Dispense Refill  . Aspirin-Acetaminophen-Caffeine (EXCEDRIN MIGRAINE PO) Take 1 tablet by mouth as needed.    Marland Kitchen atenolol (TENORMIN) 25 MG tablet Take 25 mg by mouth daily.    . fluticasone (FLONASE) 50 MCG/ACT nasal spray Place 1 spray into both nostrils 2 (two) times daily as needed for allergies or rhinitis. 16 g 0  . glycopyrrolate (ROBINUL) 1 MG tablet Take 1 tablet (1 mg total) by mouth 2 (two) times daily as needed. 60 tablet 11  . SUMAtriptan (IMITREX) 50 MG tablet Take 1 tablet by mouth at migraine onset. May repeat in 2 hours if headache persists or recurs. Do not exceed 2 tablets in 24 hours. 10 tablet 0   No current facility-administered medications on file prior to visit.     BP 112/74   Pulse 91   Temp 98.2 F (36.8 C) (Oral)   Ht 5\' 3"  (1.6 m)   Wt 114 lb 12 oz (52.1 kg)   LMP  09/10/2014   SpO2 97%   BMI 20.33 kg/m    Objective:   Physical Exam  Constitutional: She appears well-nourished.  HENT:  Mild evidence of oral thrush to tongue, appears irritated. No ulcers.  Neck: Neck supple.  Cardiovascular: Normal rate.  Respiratory: Effort normal and breath sounds normal. She has no wheezes. She has no rales.           Assessment & Plan:

## 2017-08-15 NOTE — Patient Instructions (Signed)
Start clotrimazole troches for likely oral thrush.  Use four times daily for one week.   Make sure to stay hydrated with water and rest.   It was a pleasure to see you today!

## 2017-08-17 ENCOUNTER — Other Ambulatory Visit: Payer: Self-pay | Admitting: Primary Care

## 2017-08-17 DIAGNOSIS — J019 Acute sinusitis, unspecified: Secondary | ICD-10-CM

## 2017-11-16 DIAGNOSIS — G43839 Menstrual migraine, intractable, without status migrainosus: Secondary | ICD-10-CM | POA: Diagnosis not present

## 2017-11-16 DIAGNOSIS — G43719 Chronic migraine without aura, intractable, without status migrainosus: Secondary | ICD-10-CM | POA: Diagnosis not present

## 2018-05-01 ENCOUNTER — Ambulatory Visit: Payer: BLUE CROSS/BLUE SHIELD | Admitting: Primary Care

## 2018-05-01 ENCOUNTER — Encounter: Payer: Self-pay | Admitting: Primary Care

## 2018-05-01 VITALS — BP 116/74 | HR 90 | Temp 98.2°F | Ht 63.0 in | Wt 115.2 lb

## 2018-05-01 DIAGNOSIS — Z23 Encounter for immunization: Secondary | ICD-10-CM | POA: Diagnosis not present

## 2018-05-01 DIAGNOSIS — J3489 Other specified disorders of nose and nasal sinuses: Secondary | ICD-10-CM | POA: Diagnosis not present

## 2018-05-01 NOTE — Progress Notes (Signed)
Subjective:    Patient ID: [name] [name], female    DOB: 1966/01/06, 53 y.o.   MRN: [id number]  HPI  [name] [name] is a 53 year old female who presents today with a chief complaint of nasal lesion.  The lesion is located to the inner medial side of her right nostril for which she noticed 2 months ago. She has noticed some discomfort which is mostly when sneezing/blowing her nose, otherwise no pain. She took a needle to the spot one month ago and scrapped off a layer of tissue without bleeding, but the spot remains. She denies changes in size of the sore. She's not applied anything to the sore.  She has a history of tobacco abuse and is a current smoker.  Review of Systems  Constitutional: Negative for fever.  Skin: Negative for color change and wound.       Nasal lesion       Past Medical History:  Diagnosis Date  . GERD (gastroesophageal reflux disease)   . Migraine headache   . Osteoarthritis   . Palpitations   . Premenstrual dysphoria   . Viral syndrome      Social History   Socioeconomic History  . Marital status: Married    Spouse name: Not on file  . Number of children: 1  . Years of education: Not on file  . Highest education level: Not on file  Occupational History  . Occupation: FARMER    Employer: SELF EMPLOYED  Social Needs  . Financial resource strain: Not on file  . Food insecurity:    Worry: Not on file    Inability: Not on file  . Transportation needs:    Medical: Not on file    Non-medical: Not on file  Tobacco Use  . Smoking status: Current Every Day Smoker    Packs/day: 0.75    Years: 26.00    Pack years: 19.50    Types: Cigarettes  . Smokeless tobacco: Never Used  . Tobacco comment: pt given sheet  Substance and Sexual Activity  . Alcohol use: Yes    Comment: Occassionally  . Drug use: No  . Sexual activity: Not on file  Lifestyle  . Physical activity:    Days per week: Not on file    Minutes per session: Not on file  . Stress: Not on file   Relationships  . Social connections:    Talks on phone: Not on file    Gets together: Not on file    Attends religious service: Not on file    Active member of club or organization: Not on file    Attends meetings of clubs or organizations: Not on file    Relationship status: Not on file  . Intimate partner violence:    Fear of current or ex partner: Not on file    Emotionally abused: Not on file    Physically abused: Not on file    Forced sexual activity: Not on file  Other Topics Concern  . Not on file  Social History Narrative  . Not on file    Past Surgical History:  Procedure Laterality Date  . CESAREAN SECTION    . OVARIAN CYST SURGERY      Family History  Problem Relation Age of Onset  . Diabetes Brother   . Colon cancer Neg Hx   . Colon polyps Neg Hx   . Kidney disease Neg Hx     Allergies  Allergen Reactions  . Esomeprazole Magnesium   .  Penicillins   . Sulfonamide Derivatives     Current Outpatient Medications on File Prior to Visit  Medication Sig Dispense Refill  . atenolol (TENORMIN) 25 MG tablet Take 25 mg by mouth daily.    . clotrimazole (MYCELEX) 10 MG troche Take 1 tablet (10 mg total) by mouth 4 (four) times daily. 28 tablet 0  . SUMAtriptan (IMITREX) 50 MG tablet Take 1 tablet by mouth at migraine onset. May repeat in 2 hours if headache persists or recurs. Do not exceed 2 tablets in 24 hours. 10 tablet 0  . Aspirin-Acetaminophen-Caffeine (EXCEDRIN MIGRAINE PO) Take 1 tablet by mouth as needed.    . fluticasone (FLONASE) 50 MCG/ACT nasal spray Place 1 spray into both nostrils 2 (two) times daily as needed for allergies or rhinitis. (Patient not taking: Reported on 05/01/2018) 16 g 0  . glycopyrrolate (ROBINUL) 1 MG tablet Take 1 tablet (1 mg total) by mouth 2 (two) times daily as needed. (Patient not taking: Reported on 05/01/2018) 60 tablet 11   No current facility-administered medications on file prior to visit.     BP 116/74   Pulse 90   Temp  98.2 F (36.8 C) (Oral)   Ht 5\' 3"  (1.6 m)   Wt 115 lb 4 oz (52.3 kg)   LMP 09/10/2014   SpO2 97%   BMI 20.42 kg/m    Objective:   Physical Exam  Skin: Skin is warm and dry.  Small rounded flesh-colored lesion to inner tip of right medial nostril.  No scaling, erythema.  Nontender.           Assessment & Plan:

## 2018-05-01 NOTE — Addendum Note (Signed)
Addended by: Jacqualin Combes on: 05/01/2018 04:49 PM   Modules accepted: Orders

## 2018-05-01 NOTE — Patient Instructions (Signed)
Try using Flonase (fluticasone) nasal spray. Instill 1 spray in each nostril twice daily.   Try using OTC cortisone cream once daily for one week to reduce size.  You will be contacted regarding your referral to dermatology.  Please let us know if you have not been contacted within one week.   It was a pleasure to see you today!

## 2018-05-01 NOTE — Assessment & Plan Note (Signed)
Appears benign. Does not appear to be nasal polyp, infection or carcinoma. Will trial both Flonase and OTC cortisone cream in an attempt to reduce the size. Given smoking history we referred her to dermatology for further evaluation and potential removal.

## 2018-05-02 DIAGNOSIS — G43111 Migraine with aura, intractable, with status migrainosus: Secondary | ICD-10-CM | POA: Diagnosis not present

## 2018-05-02 DIAGNOSIS — G43719 Chronic migraine without aura, intractable, without status migrainosus: Secondary | ICD-10-CM | POA: Diagnosis not present

## 2018-05-02 DIAGNOSIS — G43019 Migraine without aura, intractable, without status migrainosus: Secondary | ICD-10-CM | POA: Diagnosis not present

## 2018-05-02 DIAGNOSIS — G43839 Menstrual migraine, intractable, without status migrainosus: Secondary | ICD-10-CM | POA: Diagnosis not present

## 2018-06-05 DIAGNOSIS — M546 Pain in thoracic spine: Secondary | ICD-10-CM | POA: Diagnosis not present

## 2018-06-05 DIAGNOSIS — M545 Low back pain: Secondary | ICD-10-CM | POA: Diagnosis not present

## 2018-06-05 DIAGNOSIS — M9902 Segmental and somatic dysfunction of thoracic region: Secondary | ICD-10-CM | POA: Diagnosis not present

## 2018-06-05 DIAGNOSIS — M9903 Segmental and somatic dysfunction of lumbar region: Secondary | ICD-10-CM | POA: Diagnosis not present

## 2018-06-06 DIAGNOSIS — M9903 Segmental and somatic dysfunction of lumbar region: Secondary | ICD-10-CM | POA: Diagnosis not present

## 2018-06-06 DIAGNOSIS — M546 Pain in thoracic spine: Secondary | ICD-10-CM | POA: Diagnosis not present

## 2018-06-06 DIAGNOSIS — M545 Low back pain: Secondary | ICD-10-CM | POA: Diagnosis not present

## 2018-06-06 DIAGNOSIS — M9902 Segmental and somatic dysfunction of thoracic region: Secondary | ICD-10-CM | POA: Diagnosis not present

## 2018-06-07 DIAGNOSIS — M9902 Segmental and somatic dysfunction of thoracic region: Secondary | ICD-10-CM | POA: Diagnosis not present

## 2018-06-07 DIAGNOSIS — M546 Pain in thoracic spine: Secondary | ICD-10-CM | POA: Diagnosis not present

## 2018-06-07 DIAGNOSIS — M545 Low back pain: Secondary | ICD-10-CM | POA: Diagnosis not present

## 2018-06-07 DIAGNOSIS — M9903 Segmental and somatic dysfunction of lumbar region: Secondary | ICD-10-CM | POA: Diagnosis not present

## 2018-06-11 ENCOUNTER — Ambulatory Visit (INDEPENDENT_AMBULATORY_CARE_PROVIDER_SITE_OTHER): Payer: BLUE CROSS/BLUE SHIELD | Admitting: Primary Care

## 2018-06-11 ENCOUNTER — Encounter: Payer: Self-pay | Admitting: Primary Care

## 2018-06-11 DIAGNOSIS — B9789 Other viral agents as the cause of diseases classified elsewhere: Secondary | ICD-10-CM | POA: Diagnosis not present

## 2018-06-11 DIAGNOSIS — J069 Acute upper respiratory infection, unspecified: Secondary | ICD-10-CM | POA: Diagnosis not present

## 2018-06-11 NOTE — Assessment & Plan Note (Signed)
Symptoms suspicious for Covid-19, especially the symptoms mentioned above. Does not appear to be COPD exacerbation given presentation and lack of other cardinal symptoms including increased and purulent production.   Discussed return precautions for productive/purulent/increased sputum with fevers and shortness of breath. She verbalized understanding.  We will have her quarantine for 7 days after symptoms began which will be through Friday April 17th. We also discussed that she cannot go back to work unless she's been fever free for three days without the use of antipyretics. She verbalized understanding. Work note provided.

## 2018-06-11 NOTE — Progress Notes (Signed)
Subjective:    Patient ID: [name] [name], female    DOB: 1965-11-29, 53 y.o.   MRN: [id number]  HPI  Virtual Visit via Video Note  I connected with [name] [name] on 06/11/18 at  2:40 PM EDT by a video enabled telemedicine application and verified that I am speaking with the correct person using two identifiers.   I discussed the limitations of evaluation and management by telemedicine and the availability of in person appointments. The patient expressed understanding and agreed to proceed. I am in the office, she is at home.  History of Present Illness:  [name] [name] is a 53 year old female who presents today via video with a chief complaint of fever.  She also reports chills, sore throat, nausea without vomiting, lose stools, headaches. Her symptoms began on 06/08/18 with a fever of 103.1 and shortness of breath. She ran fever of 102 on 06/09/18, later that evening her fever reduced to 100.0. Her last temperature today is 99.7. This morning she began to experience a tickle type cough with sore throat, body aches, loose stools.  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 She's been taking Advil 400 mg 1-2 times daily. She's not had Advil today. Her cough is mostly non productive.     Observations/Objective:  Alert and oriented. Appears well overall, does appear tired. No cough during visit. Speaking in complete sentences  Assessment and Plan:  Symptoms suspicious for Covid-19, especially the symptoms mentioned above. Does not appear to be COPD exacerbation given presentation and lack of other cardinal symptoms including increased and purulent production.   Discussed return precautions for productive/purulent/increased sputum with fevers and shortness of breath. She verbalized understanding.  We will have her quarantine for 7 days after symptoms began which will be through Friday April 17th. We also discussed that she cannot go back to work unless  she's been fever free for three days without the use of antipyretics. She verbalized understanding. Work note provided.   Follow Up Instructions:  You may not be considered to return to work until Saturday April 18th, AND you must be fever free for three consecutive days without the use of fever reducing medication such as Advil and Tylenol.  Continue Advil and increase to 400-600 mg every 8 hours.   Make sure to stay hydrated with plenty of water.  Please call me if you develop an increased cough with green/thick sputum, shortness of breath, and continued fevers.   It was a pleasure to see you today!    I discussed the assessment and treatment plan with the patient. The patient was provided an opportunity to ask questions and all were answered. The patient agreed with the plan and demonstrated an understanding of the instructions.   The patient was advised to call back or seek an in-person evaluation if the symptoms worsen or if the condition fails to improve as anticipated.     [name] Koch, NP   Review of Systems  Constitutional: Positive for chills, fatigue and fever.  HENT: Positive for sore throat. Negative for congestion, ear pain, postnasal drip and sinus pressure.   Respiratory: Positive for cough. Negative for shortness of breath.   Cardiovascular: Negative for chest pain.  Musculoskeletal: Positive for myalgias.  Neurological: Positive for headaches.       Past Medical History:  Diagnosis Date  . GERD (gastroesophageal reflux disease)   . Migraine headache   . Osteoarthritis   .  Palpitations   . Premenstrual dysphoria   . Viral syndrome      Social History   Socioeconomic History  . Marital status: Married    Spouse name: Not on file  . Number of children: 1  . Years of education: Not on file  . Highest education level: Not on file  Occupational History  . Occupation: FARMER    Employer: SELF EMPLOYED  Social Needs  . Financial resource strain:  Not on file  . Food insecurity:    Worry: Not on file    Inability: Not on file  . Transportation needs:    Medical: Not on file    Non-medical: Not on file  Tobacco Use  . Smoking status: Current Every Day Smoker    Packs/day: 0.75    Years: 26.00    Pack years: 19.50    Types: Cigarettes  . Smokeless tobacco: Never Used  . Tobacco comment: pt given sheet  Substance and Sexual Activity  . Alcohol use: Yes    Comment: Occassionally  . Drug use: No  . Sexual activity: Not on file  Lifestyle  . Physical activity:    Days per week: Not on file    Minutes per session: Not on file  . Stress: Not on file  Relationships  . Social connections:    Talks on phone: Not on file    Gets together: Not on file    Attends religious service: Not on file    Active member of club or organization: Not on file    Attends meetings of clubs or organizations: Not on file    Relationship status: Not on file  . Intimate partner violence:    Fear of current or ex partner: Not on file    Emotionally abused: Not on file    Physically abused: Not on file    Forced sexual activity: Not on file  Other Topics Concern  . Not on file  Social History Narrative  . Not on file    Past Surgical History:  Procedure Laterality Date  . CESAREAN SECTION    . OVARIAN CYST SURGERY      Family History  Problem Relation Age of Onset  . Diabetes Brother   . Colon cancer Neg Hx   . Colon polyps Neg Hx   . Kidney disease Neg Hx     Allergies  Allergen Reactions  . Esomeprazole Magnesium   . Penicillins   . Sulfonamide Derivatives     Current Outpatient Medications on File Prior to Visit  Medication Sig Dispense Refill  . Aspirin-Acetaminophen-Caffeine (EXCEDRIN MIGRAINE PO) Take 1 tablet by mouth as needed.    Marland Kitchen atenolol (TENORMIN) 25 MG tablet Take 25 mg by mouth daily.    . clotrimazole (MYCELEX) 10 MG troche Take 1 tablet (10 mg total) by mouth 4 (four) times daily. 28 tablet 0  .  glycopyrrolate (ROBINUL) 1 MG tablet Take 1 tablet (1 mg total) by mouth 2 (two) times daily as needed. 60 tablet 11  . SUMAtriptan (IMITREX) 50 MG tablet Take 1 tablet by mouth at migraine onset. May repeat in 2 hours if headache persists or recurs. Do not exceed 2 tablets in 24 hours. 10 tablet 0  . fluticasone (FLONASE) 50 MCG/ACT nasal spray Place 1 spray into both nostrils 2 (two) times daily as needed for allergies or rhinitis. (Patient not taking: Reported on 06/11/2018) 16 g 0   No current facility-administered medications on file prior to visit.  LMP 09/10/2014    Objective:   Physical Exam  Constitutional: She is oriented to person, place, and time.  Appears tired  Respiratory: Effort normal.  No cough during exam  Neurological: She is alert and oriented to person, place, and time.  Psychiatric: She has a normal mood and affect.           Assessment & Plan:

## 2018-06-11 NOTE — Patient Instructions (Signed)
You may not be considered to return to work until Saturday April 18th, AND you must be fever free for three consecutive days without the use of fever reducing medication such as Advil and Tylenol.  Continue Advil and increase to 400-600 mg every 8 hours.   Make sure to stay hydrated with plenty of water.  Please call me if you develop an increased cough with green/thick sputum, shortness of breath, and continued fevers.   It was a pleasure to see you today!

## 2018-06-27 ENCOUNTER — Ambulatory Visit (INDEPENDENT_AMBULATORY_CARE_PROVIDER_SITE_OTHER): Payer: BLUE CROSS/BLUE SHIELD | Admitting: Primary Care

## 2018-06-27 ENCOUNTER — Encounter: Payer: Self-pay | Admitting: Primary Care

## 2018-06-27 DIAGNOSIS — J309 Allergic rhinitis, unspecified: Secondary | ICD-10-CM

## 2018-06-27 NOTE — Progress Notes (Signed)
Subjective:    Patient ID: [name] [name], female    DOB: 14-Dec-1965, 54 y.o.   MRN: [id number]  HPI  Virtual Visit via Video Note  I connected with Noam Franzen Losano on 06/27/18 at 11:40 AM EDT by a video enabled telemedicine application and verified that I am speaking with the correct person using two identifiers.   I discussed the limitations of evaluation and management by telemedicine and the availability of in person appointments. The patient expressed understanding and agreed to proceed. She is at home, I am in the office.  History of Present Illness:  [name] [name] is a 53 year old female with a history of GERD, migraines, tobacco abuse who presents today with a chief complaint of sore throat.  She also reports swelling to a glad to the left side. She also reports tickle type cough, sneezing, rhinorrhea. Overall she's feeling much better from her prior visit for Covid-19 like symptoms. She was never tested. She's been back to work for the first time, had not been out of her house for one week. She's not taken anything OTC for her symptoms. She denies fevers, sinus pressure, fatigue.    Observations/Objective:  Alert and oriented x3. No respiratory distress. No cough during visit. Appears well and much better than last visit.  Assessment and Plan:  Suspect symptoms are secondary to allergies given that she hasn't been out of her home until just recently. Reassurance provided, she appears very well. Discussed use of Claritin and Flonase. Return precautions provided.   Follow Up Instructions:  Start loratadine (Claritin) once daily for allergy symptoms.  Nasal Congestion/Ear Pressure/Sinus Pressure: Try using Flonase (fluticasone) nasal spray. Instill 1 spray in each nostril twice daily.   Please notify me if you develop persistent fevers of 101, develop shortness of breath or increased shortness of breath, and/or feel worse after 1 week of onset of symptoms.   It was a pleasure to  see you today!   I discussed the assessment and treatment plan with the patient. The patient was provided an opportunity to ask questions and all were answered. The patient agreed with the plan and demonstrated an understanding of the instructions.   The patient was advised to call back or seek an in-person evaluation if the symptoms worsen or if the condition fails to improve as anticipated.     Pleas Koch, NP    Review of Systems  Constitutional: Negative for chills, fatigue and fever.  HENT: Positive for postnasal drip, rhinorrhea and sore throat. Negative for congestion, ear pain, sinus pressure and sinus pain.   Respiratory: Positive for cough. Negative for shortness of breath.   Allergic/Immunologic: Positive for environmental allergies.       Past Medical History:  Diagnosis Date  . GERD (gastroesophageal reflux disease)   . Migraine headache   . Osteoarthritis   . Palpitations   . Premenstrual dysphoria   . Viral syndrome      Social History   Socioeconomic History  . Marital status: Married    Spouse name: Not on file  . Number of children: 1  . Years of education: Not on file  . Highest education level: Not on file  Occupational History  . Occupation: FARMER    Employer: SELF EMPLOYED  Social Needs  . Financial resource strain: Not on file  . Food insecurity:    Worry: Not on file    Inability: Not on file  . Transportation needs:    Medical: Not  on file    Non-medical: Not on file  Tobacco Use  . Smoking status: Current Every Day Smoker    Packs/day: 0.75    Years: 26.00    Pack years: 19.50    Types: Cigarettes  . Smokeless tobacco: Never Used  . Tobacco comment: pt given sheet  Substance and Sexual Activity  . Alcohol use: Yes    Comment: Occassionally  . Drug use: No  . Sexual activity: Not on file  Lifestyle  . Physical activity:    Days per week: Not on file    Minutes per session: Not on file  . Stress: Not on file   Relationships  . Social connections:    Talks on phone: Not on file    Gets together: Not on file    Attends religious service: Not on file    Active member of club or organization: Not on file    Attends meetings of clubs or organizations: Not on file    Relationship status: Not on file  . Intimate partner violence:    Fear of current or ex partner: Not on file    Emotionally abused: Not on file    Physically abused: Not on file    Forced sexual activity: Not on file  Other Topics Concern  . Not on file  Social History Narrative  . Not on file    Past Surgical History:  Procedure Laterality Date  . CESAREAN SECTION    . OVARIAN CYST SURGERY      Family History  Problem Relation Age of Onset  . Diabetes Brother   . Colon cancer Neg Hx   . Colon polyps Neg Hx   . Kidney disease Neg Hx     Allergies  Allergen Reactions  . Esomeprazole Magnesium   . Penicillins   . Sulfonamide Derivatives     Current Outpatient Medications on File Prior to Visit  Medication Sig Dispense Refill  . Aspirin-Acetaminophen-Caffeine (EXCEDRIN MIGRAINE PO) Take 1 tablet by mouth as needed.    Marland Kitchen atenolol (TENORMIN) 25 MG tablet Take 25 mg by mouth daily.    . clotrimazole (MYCELEX) 10 MG troche Take 1 tablet (10 mg total) by mouth 4 (four) times daily. 28 tablet 0  . fluticasone (FLONASE) 50 MCG/ACT nasal spray Place 1 spray into both nostrils 2 (two) times daily as needed for allergies or rhinitis. 16 g 0  . glycopyrrolate (ROBINUL) 1 MG tablet Take 1 tablet (1 mg total) by mouth 2 (two) times daily as needed. 60 tablet 11  . SUMAtriptan (IMITREX) 50 MG tablet Take 1 tablet by mouth at migraine onset. May repeat in 2 hours if headache persists or recurs. Do not exceed 2 tablets in 24 hours. 10 tablet 0   No current facility-administered medications on file prior to visit.     LMP 09/10/2014    Objective:   Physical Exam  Constitutional: She is oriented to person, place, and time. She  appears well-nourished. She does not have a sickly appearance. She does not appear ill.  Respiratory: Effort normal. No respiratory distress.  Neurological: She is alert and oriented to person, place, and time.  Skin: Skin is dry.  Psychiatric: She has a normal mood and affect.           Assessment & Plan:

## 2018-06-27 NOTE — Assessment & Plan Note (Signed)
Suspect symptoms are secondary to allergies given that she hasn't been out of her home until just recently. Reassurance provided, she appears very well. Discussed use of Claritin and Flonase. Return precautions provided.

## 2018-06-27 NOTE — Patient Instructions (Signed)
Start loratadine (Claritin) once daily for allergy symptoms.  Nasal Congestion/Ear Pressure/Sinus Pressure: Try using Flonase (fluticasone) nasal spray. Instill 1 spray in each nostril twice daily.   Please notify me if you develop persistent fevers of 101, develop shortness of breath or increased shortness of breath, and/or feel worse after 1 week of onset of symptoms.   It was a pleasure to see you today!

## 2018-11-13 DIAGNOSIS — G43019 Migraine without aura, intractable, without status migrainosus: Secondary | ICD-10-CM | POA: Diagnosis not present

## 2018-11-13 DIAGNOSIS — G43719 Chronic migraine without aura, intractable, without status migrainosus: Secondary | ICD-10-CM | POA: Diagnosis not present

## 2018-11-13 DIAGNOSIS — G43839 Menstrual migraine, intractable, without status migrainosus: Secondary | ICD-10-CM | POA: Diagnosis not present

## 2018-11-13 DIAGNOSIS — G43111 Migraine with aura, intractable, with status migrainosus: Secondary | ICD-10-CM | POA: Diagnosis not present

## 2018-11-14 ENCOUNTER — Ambulatory Visit (INDEPENDENT_AMBULATORY_CARE_PROVIDER_SITE_OTHER): Payer: BC Managed Care – PPO | Admitting: Primary Care

## 2018-11-14 ENCOUNTER — Encounter: Payer: Self-pay | Admitting: Primary Care

## 2018-11-14 ENCOUNTER — Other Ambulatory Visit: Payer: Self-pay

## 2018-11-14 VITALS — BP 110/70 | HR 72 | Temp 98.5°F | Ht 63.0 in | Wt 111.5 lb

## 2018-11-14 DIAGNOSIS — F40243 Fear of flying: Secondary | ICD-10-CM | POA: Diagnosis not present

## 2018-11-14 DIAGNOSIS — F32A Depression, unspecified: Secondary | ICD-10-CM | POA: Insufficient documentation

## 2018-11-14 DIAGNOSIS — F329 Major depressive disorder, single episode, unspecified: Secondary | ICD-10-CM

## 2018-11-14 DIAGNOSIS — N924 Excessive bleeding in the premenopausal period: Secondary | ICD-10-CM | POA: Diagnosis not present

## 2018-11-14 MED ORDER — DIAZEPAM 2 MG PO TABS: ORAL_TABLET | ORAL | 0 refills | Status: DC

## 2018-11-14 NOTE — Assessment & Plan Note (Signed)
Will be flying to [place] soon for a stress reduction retreat. Agree to low dose Valium to use PRN for flights. Discussed potential side effects, she tolerated well in the past.

## 2018-11-14 NOTE — Patient Instructions (Signed)
You may take the diazepam 2 mg tablets as needed for anxiety for flight. Take 1 tablet by mouth 30 minutes prior to flight, do not drive when taking.  You will be contacted regarding your referral to therapy and gynecology.  Please let us know if you have not been contacted within one week.   It was a pleasure to see you today!

## 2018-11-14 NOTE — Progress Notes (Signed)
Subjective:    Patient ID: [name] [name], female    DOB: 07-Oct-1965, 53 y.o.   MRN: [id number]  HPI  Ms. [name] [name] is a 53 year old female with a history of tobacco abuse, migraines, chronic back/shoulder/neck pain, ovarian cysts bilaterally who presents today to discuss birth control.  She is a current smoker and is smoking 1 PPD since the age of 4. She is managed on atenolol for chronic migraines.   She is experiencing two menstrual cycles annually for the last two years. Her last menstrual cycle was 10/14/2018 which lasted for three weeks which was mostly heavy without clotting with dysmenorrhea.  She is changing a pad every 2-3 hours. No breakthrough bleeding in between cycles. Prior to two years ago her menstrual cycles were regular. Menarche at age 21. She was on OCP's from age 48 to 74.   She does have migraines with her menstrual cycles with photophobia and nausea. This is her main concern today. She's been taking Advil and Excedrin Migraine with some improvement.   She is not sexually active. Last pap smear was in 2019, negative. Also experiencing daily hot flashes over the last one year with occasional palpitations. Also with some symptoms depression (teafulness, feeling down, slight difficulty sleeping, decrease in appetite) and increased stress with her husband for whom she is currently separated. She will be attending a retreat for stress management soon.   She saw her migraine specialist yesterday who continued her atenolol 25 mg BID. She will be flying to [place] in a few weeks for her stress retreat and has a phobia of flying. She was once provided with diazepam 2.5 mg to reduce acute anxiety during a dental appointment which helped. She is requesting this for her upcoming flights. PHQ 9 score of 19 today.   BP Readings from Last 3 Encounters:  11/14/18 110/70  05/01/18 116/74  08/15/17 112/74     Review of Systems  Genitourinary: Positive for menstrual problem. Negative for  hematuria, pelvic pain and vaginal discharge.       Hot flashes  Psychiatric/Behavioral:       See HPI       Past Medical History:  Diagnosis Date  . GERD (gastroesophageal reflux disease)   . Migraine headache   . Osteoarthritis   . Palpitations   . Premenstrual dysphoria   . Viral syndrome      Social History   Socioeconomic History  . Marital status: Married    Spouse name: Not on file  . Number of children: 1  . Years of education: Not on file  . Highest education level: Not on file  Occupational History  . Occupation: FARMER    Employer: SELF EMPLOYED  Social Needs  . Financial resource strain: Not on file  . Food insecurity    Worry: Not on file    Inability: Not on file  . Transportation needs    Medical: Not on file    Non-medical: Not on file  Tobacco Use  . Smoking status: Current Every Day Smoker    Packs/day: 0.75    Years: 26.00    Pack years: 19.50    Types: Cigarettes  . Smokeless tobacco: Never Used  . Tobacco comment: pt given sheet  Substance and Sexual Activity  . Alcohol use: Yes    Comment: Occassionally  . Drug use: No  . Sexual activity: Not on file  Lifestyle  . Physical activity    Days per week: Not on file  Minutes per session: Not on file  . Stress: Not on file  Relationships  . Social Herbalist on phone: Not on file    Gets together: Not on file    Attends religious service: Not on file    Active member of club or organization: Not on file    Attends meetings of clubs or organizations: Not on file    Relationship status: Not on file  . Intimate partner violence    Fear of current or ex partner: Not on file    Emotionally abused: Not on file    Physically abused: Not on file    Forced sexual activity: Not on file  Other Topics Concern  . Not on file  Social History Narrative  . Not on file    Past Surgical History:  Procedure Laterality Date  . CESAREAN SECTION    . OVARIAN CYST SURGERY      Family  History  Problem Relation Age of Onset  . Diabetes Brother   . Colon cancer Neg Hx   . Colon polyps Neg Hx   . Kidney disease Neg Hx     Allergies  Allergen Reactions  . Esomeprazole Magnesium   . Penicillins   . Sulfonamide Derivatives     Current Outpatient Medications on File Prior to Visit  Medication Sig Dispense Refill  . Aspirin-Acetaminophen-Caffeine (EXCEDRIN MIGRAINE PO) Take 1 tablet by mouth as needed.    Marland Kitchen atenolol (TENORMIN) 25 MG tablet Take 25 mg by mouth daily.    . clotrimazole (MYCELEX) 10 MG troche Take 1 tablet (10 mg total) by mouth 4 (four) times daily. 28 tablet 0  . fluticasone (FLONASE) 50 MCG/ACT nasal spray Place 1 spray into both nostrils 2 (two) times daily as needed for allergies or rhinitis. 16 g 0  . SUMAtriptan (IMITREX) 50 MG tablet Take 1 tablet by mouth at migraine onset. May repeat in 2 hours if headache persists or recurs. Do not exceed 2 tablets in 24 hours. 10 tablet 0   No current facility-administered medications on file prior to visit.     BP 110/70   Pulse 72   Temp 98.5 F (36.9 C) (Temporal)   Ht 5\' 3"  (1.6 m)   Wt 111 lb 8 oz (50.6 kg)   LMP 10/14/2018   SpO2 98%   BMI 19.75 kg/m    Objective:   Physical Exam  Constitutional: She appears well-nourished.  Neck: Neck supple.  Cardiovascular: Normal rate and regular rhythm.  Respiratory: Effort normal and breath sounds normal.  Skin: Skin is warm and dry.  Psychiatric: She has a normal mood and affect.           Assessment & Plan:
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
## 2018-11-14 NOTE — Assessment & Plan Note (Addendum)
Heavy and painful menstrual bleeding twice annually that will last for 3 weeks at a time. History of ovarian cysts, 33 pack year history of tobacco abuse.  Given her tobacco abuse, history of migraines, and age she would not be a candidate for combined contraceptive treatment. Also, given her history of ovarian cysts with perimenopausal symptoms we will refer her to GYN for further evaluation and ultrasound. Referral placed.

## 2018-11-15 ENCOUNTER — Telehealth: Payer: Self-pay | Admitting: Obstetrics & Gynecology

## 2018-11-15 NOTE — Telephone Encounter (Signed)
[facility] referring Perimenopausal menorrhagia. Called and left voicemail for patient to call back to be schedule

## 2018-11-26 NOTE — Telephone Encounter (Signed)
Appointment was cancellation due to Provider change. Left voicemail for patient to call back to r/s 11/21/18. Called today Called and left voicemail for patient to call back to be schedule

## 2018-11-28 NOTE — Telephone Encounter (Signed)
Called and left voice mail for patient to call back to be schedule °

## 2018-11-28 NOTE — Telephone Encounter (Signed)
Contacted PCP due to multiple attempts not successful to reach patient to schedule

## 2018-12-10 ENCOUNTER — Encounter: Payer: Self-pay | Admitting: Obstetrics and Gynecology

## 2019-01-14 ENCOUNTER — Encounter: Payer: Self-pay | Admitting: Obstetrics & Gynecology

## 2019-01-14 ENCOUNTER — Ambulatory Visit (INDEPENDENT_AMBULATORY_CARE_PROVIDER_SITE_OTHER): Payer: BC Managed Care – PPO | Admitting: Obstetrics & Gynecology

## 2019-01-14 ENCOUNTER — Other Ambulatory Visit: Payer: Self-pay

## 2019-01-14 VITALS — BP 100/60 | Ht 64.0 in | Wt 110.0 lb

## 2019-01-14 DIAGNOSIS — M545 Low back pain, unspecified: Secondary | ICD-10-CM

## 2019-01-14 DIAGNOSIS — Z780 Asymptomatic menopausal state: Secondary | ICD-10-CM | POA: Diagnosis not present

## 2019-01-14 MED ORDER — NORETHINDRONE ACETATE 5 MG PO TABS: 5.0000 mg | ORAL_TABLET | Freq: Every day | ORAL | 11 refills | Status: DC

## 2019-01-14 NOTE — Patient Instructions (Signed)
Norethindrone acetate (hormone replacement) What is this medicine? NORETHINDRONE ACETATE (nor eth IN drone AS e tate) is a female hormone. This medicine is used to treat endometriosis, uterine bleeding caused by abnormal hormone levels, and secondary amenorrhea. Secondary amenorrhea is when a woman stops getting menstrual periods due to low levels of certain female hormones. This medicine may be used for other purposes; ask your health care provider or pharmacist if you have questions. COMMON BRAND NAME(S): Aygestin What should I tell my health care provider before I take this medicine? They need to know if you have any of these conditions:  blood vessel disease or blood clots  breast, cervical, or vaginal cancer  diabetes  heart disease  kidney disease  liver disease  mental depression  migraine  seizures  stroke  vaginal bleeding  an unusual or allergic reaction to norethindrone, other medicines, foods, dyes, or preservatives  pregnant or trying to get pregnant  breast-feeding How should I use this medicine? Take this medicine by mouth with a glass of water. You may take this medicine with or without food. Follow the directions on the prescription label. Take this medicine at the same time each day. Do not take your medicine more often than directed. A patient information sheet will be given with each prescription and refill. Read this sheet carefully each time. The sheet may change frequently. Talk to your pediatrician regarding the use of this medicine in children. Special care may be needed. Overdosage: If you think you have taken too much of this medicine contact a poison control center or emergency room at once. NOTE: This medicine is only for you. Do not share this medicine with others. What if I miss a dose? If you miss a dose, take it as soon as you can. If it is almost time for your next dose, take only that dose. Do not take double or extra doses. What may  interact with this medicine? Do not take this medicine with any of the following medications:  amprenavir or fosamprenavir  bosentan This medicine may also interact with the following medications:  antibiotics or medicines for infections, especially rifampin, rifabutin, rifapentine, and griseofulvin, and possibly penicillins or tetracyclines  aprepitant  barbiturate medicines, such as phenobarbital  carbamazepine  felbamate  modafinil  oxcarbazepine  phenytoin  ritonavir or other medicines for HIV infection or AIDS  St. John's wort  topiramate This list may not describe all possible interactions. Give your health care provider a list of all the medicines, herbs, non-prescription drugs, or dietary supplements you use. Also tell them if you smoke, drink alcohol, or use illegal drugs. Some items may interact with your medicine. What should I watch for while using this medicine? Visit your doctor or health care professional for regular checks on your progress. You will need a regular breast and pelvic exam and Pap smear while on this medicine. If you have any reason to think you are pregnant, stop taking this medicine right away and contact your doctor or health care professional. If you are taking this medicine for hormone related problems, it may take several cycles of use to see improvement in your condition. What side effects may I notice from receiving this medicine? Side effects that you should report to your doctor or health care professional as soon as possible:  breast tenderness or discharge  pain in the abdomen, chest, groin or leg  severe headache  skin rash, itching, or hives  sudden shortness of breath  unusually weak or   tired  vision or speech problems  yellowing of skin or eyes Side effects that usually do not require medical attention (report to your doctor or health care professional if they continue or are bothersome):  changes in sexual  desire  change in menstrual flow  facial hair growth  fluid retention and swelling  headache  irritability  nausea  weight gain or loss This list may not describe all possible side effects. Call your doctor for medical advice about side effects. You may report side effects to FDA at 1-800-FDA-1088. Where should I keep my medicine? Keep out of the reach of children. Store at room temperature between 15 and 30 degrees C (59 and 86 degrees F). Throw away any unused medicine after the expiration date. NOTE: This sheet is a summary. It may not cover all possible information. If you have questions about this medicine, talk to your doctor, pharmacist, or health care provider.  2020 Elsevier/Gold Standard (2007-09-10 14:38:36)  

## 2019-01-14 NOTE — Progress Notes (Signed)
HPI:      Ms. [name] [name] is a 53 y.o. G1P1001 who is perimenopausal, presents today for a problem visit.  She complains of hot flashes, no energy, bloating and swelling w her irreg periods.   Symptoms have been present for 18 months. Symptoms are mod to severe and has led her to come in today to seek options for intervention.  Priods every few mos.  Associated w low back pain intense at times.  Migraines in past but less so now.  Hot flashes for 2 years now.  Last PAP 2019.  Prior dermoid cyst excision many years ago.  Prior OCP use, led to worsening HTN.  Also since dx w migraines has not taken estrogen meds.  Previous Treatment: none  She is single partner, contraception - none. Denies PostMenopausal Bleeding  PMHx: She  has a past medical history of GERD (gastroesophageal reflux disease), Migraine headache, Osteoarthritis, Palpitations, Premenstrual dysphoria, and Viral syndrome. Also,  has a past surgical history that includes Cesarean section and Ovarian cyst surgery., family history includes Diabetes in her brother.,  reports that she has been smoking cigarettes. She has a 19.50 pack-year smoking history. She has never used smokeless tobacco. She reports current alcohol use. She reports that she does not use drugs.  She has a current medication list which includes the following prescription(s): atenolol, aspirin-acetaminophen-caffeine, clotrimazole, diazepam, fluticasone, norethindrone, and sumatriptan. Also, is allergic to esomeprazole magnesium; penicillins; and sulfonamide derivatives.  Review of Systems  Constitutional: Negative for chills, fever and malaise/fatigue.  HENT: Negative for congestion, sinus pain and sore throat.   Eyes: Negative for blurred vision and pain.  Respiratory: Negative for cough and wheezing.   Cardiovascular: Negative for chest pain and leg swelling.  Gastrointestinal: Negative for abdominal pain, constipation, diarrhea, heartburn, nausea and vomiting.   Genitourinary: Negative for dysuria, frequency, hematuria and urgency.  Musculoskeletal: Negative for back pain, joint pain, myalgias and neck pain.  Skin: Negative for itching and rash.  Neurological: Negative for dizziness, tremors and weakness.  Endo/Heme/Allergies: Does not bruise/bleed easily.  Psychiatric/Behavioral: Negative for depression. The patient is not nervous/anxious and does not have insomnia.     Objective: BP 100/60   Ht 5\' 4"  (1.626 m)   Wt 110 lb (49.9 kg)   BMI 18.88 kg/m  Physical Exam Constitutional:      General: She is not in acute distress.    Appearance: She is well-developed.  Genitourinary:     Pelvic exam was performed with patient supine.     Vagina, uterus and rectum normal.     No lesions in the vagina.     No vaginal bleeding.     No cervical motion tenderness, friability, lesion or polyp.     Uterus is mobile.     Uterus is not enlarged.     No uterine mass detected.    Uterus is midaxial.     No right or left adnexal mass present.     Right adnexa not tender.     Left adnexa not tender.  HENT:     Head: Normocephalic and atraumatic. No laceration.     Right Ear: Hearing normal.     Left Ear: Hearing normal.     Mouth/Throat:     Pharynx: Uvula midline.  Eyes:     Pupils: Pupils are equal, round, and reactive to light.  Neck:     Musculoskeletal: Normal range of motion and neck supple.     Thyroid: No thyromegaly.  Cardiovascular:     Rate and Rhythm: Normal rate and regular rhythm.     Heart sounds: No murmur. No friction rub. No gallop.   Pulmonary:     Effort: Pulmonary effort is normal. No respiratory distress.     Breath sounds: Normal breath sounds. No wheezing.  Abdominal:     General: Bowel sounds are normal. There is no distension.     Palpations: Abdomen is soft.     Tenderness: There is no abdominal tenderness. There is no rebound.  Musculoskeletal: Normal range of motion.  Neurological:     Mental Status: She is  alert and oriented to person, place, and time.     Cranial Nerves: No cranial nerve deficit.  Skin:    General: Skin is warm and dry.  Psychiatric:        Judgment: Judgment normal.  Vitals signs reviewed.     ASSESSMENT/PLAN:  Menopause. Plan labs and meds Discussed uterus position and tilt and may contribute to low back pain Stress and other effects on menopause as well Norethinedrone 5 mg daily F/u 2 mos  Barnett Applebaum, MD, [facility], [facility] 01/14/2019  3:42 PM

## 2019-01-15 LAB — FSH/LH
FSH: 59.8 m[IU]/mL
LH: 35.3 m[IU]/mL

## 2019-01-15 LAB — ESTRADIOL: Estradiol: 38.1 pg/mL

## 2019-01-21 ENCOUNTER — Emergency Department: Payer: BC Managed Care – PPO

## 2019-01-21 ENCOUNTER — Encounter: Payer: Self-pay | Admitting: Emergency Medicine

## 2019-01-21 ENCOUNTER — Emergency Department
Admission: EM | Admit: 2019-01-21 | Discharge: 2019-01-21 | Disposition: A | Discharge status: Home / Self Care | Payer: BC Managed Care – PPO | Attending: Emergency Medicine | Admitting: Emergency Medicine

## 2019-01-21 ENCOUNTER — Other Ambulatory Visit: Payer: Self-pay

## 2019-01-21 DIAGNOSIS — Z79899 Other long term (current) drug therapy: Secondary | ICD-10-CM | POA: Insufficient documentation

## 2019-01-21 DIAGNOSIS — M9901 Segmental and somatic dysfunction of cervical region: Secondary | ICD-10-CM | POA: Diagnosis not present

## 2019-01-21 DIAGNOSIS — N10 Acute tubulo-interstitial nephritis: Secondary | ICD-10-CM | POA: Insufficient documentation

## 2019-01-21 DIAGNOSIS — N12 Tubulo-interstitial nephritis, not specified as acute or chronic: Secondary | ICD-10-CM

## 2019-01-21 DIAGNOSIS — F17210 Nicotine dependence, cigarettes, uncomplicated: Secondary | ICD-10-CM | POA: Diagnosis not present

## 2019-01-21 DIAGNOSIS — R1030 Lower abdominal pain, unspecified: Secondary | ICD-10-CM | POA: Diagnosis not present

## 2019-01-21 DIAGNOSIS — M9902 Segmental and somatic dysfunction of thoracic region: Secondary | ICD-10-CM | POA: Diagnosis not present

## 2019-01-21 DIAGNOSIS — M546 Pain in thoracic spine: Secondary | ICD-10-CM | POA: Diagnosis not present

## 2019-01-21 DIAGNOSIS — M5413 Radiculopathy, cervicothoracic region: Secondary | ICD-10-CM | POA: Diagnosis not present

## 2019-01-21 DIAGNOSIS — M545 Low back pain: Secondary | ICD-10-CM | POA: Diagnosis not present

## 2019-01-21 LAB — COMPREHENSIVE METABOLIC PANEL
ALT: 11 U/L (ref 0–44)
AST: 15 U/L (ref 15–41)
Albumin: 4.3 g/dL (ref 3.5–5.0)
Alkaline Phosphatase: 66 U/L (ref 38–126)
Anion gap: 11 (ref 5–15)
BUN: 7 mg/dL (ref 6–20)
CO2: 24 mmol/L (ref 22–32)
Calcium: 9.2 mg/dL (ref 8.9–10.3)
Chloride: 102 mmol/L (ref 98–111)
Creatinine, Ser: 0.65 mg/dL (ref 0.44–1.00)
GFR calc Af Amer: >60 mL/min (ref >60)
GFR calc non Af Amer: >60 mL/min (ref >60)
Glucose, Bld: 143 mg/dL — ABNORMAL HIGH (ref 70–99)
Potassium: 3.5 mmol/L (ref 3.5–5.1)
Sodium: 137 mmol/L (ref 135–145)
Total Bilirubin: 0.8 mg/dL (ref 0.3–1.2)
Total Protein: 7.2 g/dL (ref 6.5–8.1)

## 2019-01-21 LAB — URINALYSIS, COMPLETE (UACMP) WITH MICROSCOPIC
Bilirubin Urine: NEGATIVE
Glucose, UA: NEGATIVE mg/dL
Ketones, ur: NEGATIVE mg/dL
Nitrite: POSITIVE — AB
Protein, ur: NEGATIVE mg/dL
Specific Gravity, Urine: 1.002 — ABNORMAL LOW (ref 1.005–1.030)
pH: 6 (ref 5.0–8.0)

## 2019-01-21 LAB — CBC
HCT: 39.1 % (ref 36.0–46.0)
Hemoglobin: 13.5 g/dL (ref 12.0–15.0)
MCH: 32 pg (ref 26.0–34.0)
MCHC: 34.5 g/dL (ref 30.0–36.0)
MCV: 92.7 fL (ref 80.0–100.0)
Platelets: 253 10*3/uL (ref 150–400)
RBC: 4.22 MIL/uL (ref 3.87–5.11)
RDW: 12.7 % (ref 11.5–15.5)
WBC: 11 10*3/uL — ABNORMAL HIGH (ref 4.0–10.5)
nRBC: 0 % (ref 0.0–0.2)

## 2019-01-21 LAB — LIPASE, BLOOD: Lipase: 25 U/L (ref 11–51)

## 2019-01-21 MED ORDER — SODIUM CHLORIDE 0.9% FLUSH: 3.0000 mL | Freq: Once | INTRAVENOUS | Status: DC

## 2019-01-21 MED ORDER — MORPHINE SULFATE (PF) 4 MG/ML IV SOLN
4.0000 mg | Freq: Once | INTRAVENOUS | Status: DC
  Filled 2019-01-21: qty 1

## 2019-01-21 MED ORDER — LEVOFLOXACIN IN D5W 750 MG/150ML IV SOLN
750.0000 mg | Freq: Once | INTRAVENOUS | Status: AC
  Administered 2019-01-21: 750 mg via INTRAVENOUS
  Filled 2019-01-21: qty 150

## 2019-01-21 MED ORDER — HYDROCODONE-ACETAMINOPHEN 5-325 MG PO TABS: 1.0000 | ORAL_TABLET | Freq: Four times a day (QID) | ORAL | 0 refills | Status: DC | PRN

## 2019-01-21 MED ORDER — KETOROLAC TROMETHAMINE 30 MG/ML IJ SOLN
15.0000 mg | Freq: Once | INTRAMUSCULAR | Status: AC
  Administered 2019-01-21: 15 mg via INTRAVENOUS
  Filled 2019-01-21: qty 1

## 2019-01-21 MED ORDER — ONDANSETRON 4 MG PO TBDP: 4.0000 mg | ORAL_TABLET | Freq: Three times a day (TID) | ORAL | 0 refills | Status: DC | PRN

## 2019-01-21 MED ORDER — CIPROFLOXACIN HCL 500 MG PO TABS: 500.0000 mg | ORAL_TABLET | Freq: Two times a day (BID) | ORAL | 0 refills | Status: AC

## 2019-01-21 MED ORDER — SODIUM CHLORIDE 0.9 % IV BOLUS
1000.0000 mL | Freq: Once | INTRAVENOUS | Status: AC
  Administered 2019-01-21: 1000 mL via INTRAVENOUS

## 2019-01-21 MED ORDER — ONDANSETRON HCL 4 MG/2ML IJ SOLN
4.0000 mg | Freq: Once | INTRAMUSCULAR | Status: DC
  Filled 2019-01-21: qty 2

## 2019-01-21 NOTE — Discharge Instructions (Addendum)
Take ibuprofen 600 mg every 8 hours for moderate pain  Take the stronger prescribed medicine for severe pain  Take the antibiotic as prescribed

## 2019-01-21 NOTE — ED Provider Notes (Signed)
[facility] Emergency Department Provider Note  ____________________________________________   First MD Initiated Contact with Patient 01/21/19 2111     (approximate)  I have reviewed the triage vital signs and the nursing notes.   HISTORY  Chief Complaint Back Pain    HPI [name] [name] is a 53 y.o. female  Here with lower abdominal pain and flank pain. Pt reports that for the past 2 days, she has had intermittent episodes of aching, cramping, significant suprapubic pain occasionally radiating toward her flank. The pain has been somewhat constant today but does intermitently worsen. She has a h/o recurrent UTIs and though she has not had overt dysuria, does not increased frequency, foul odor. No fevers. Mild nausea but no vomiting. No vaginal bleeding or discharge. No h/o kidney stones. Pain is constant, no specific alleviating or aggravating factors.        Past Medical History:  Diagnosis Date  . GERD (gastroesophageal reflux disease)   . Migraine headache   . Osteoarthritis   . Palpitations   . Premenstrual dysphoria   . Viral syndrome     Patient Active Problem List   Diagnosis Date Noted  . Perimenopausal menorrhagia 11/14/2018  . Depression 11/14/2018  . Fear of flying 11/14/2018  . Internal nasal lesion 05/01/2018  . Preventative health care 04/10/2017  . Chronic back pain 09/27/2016  . Chronic shoulder pain 09/27/2016  . Chronic neck pain 09/27/2016  . ABDOMINAL PAIN-MULTIPLE SITES 02/15/2010  . NICOTINE ADDICTION 07/31/2009  . Chronic migraine 03/22/2007  . MITRAL VALVE PROLAPSE 03/22/2007  . Allergic rhinitis 03/22/2007  . G E R D 03/22/2007    Past Surgical History:  Procedure Laterality Date  . CESAREAN SECTION    . OVARIAN CYST SURGERY      Prior to Admission medications   Medication Sig Start Date End Date Taking? Authorizing Provider  Aspirin-Acetaminophen-Caffeine (EXCEDRIN MIGRAINE PO) Take 1 tablet by mouth as  needed.   Yes [provider]  atenolol (TENORMIN) 25 MG tablet Take by mouth 2 (two) times daily.   Yes [provider]  norethindrone (AYGESTIN) 5 MG tablet Take 1 tablet (5 mg total) by mouth daily. 01/14/19  Yes Gae Dry, MD  ciprofloxacin (CIPRO) 500 MG tablet Take 1 tablet (500 mg total) by mouth 2 (two) times daily for 10 days. 01/21/19 01/31/19  Duffy Bruce, MD  clotrimazole (MYCELEX) 10 MG troche Take 1 tablet (10 mg total) by mouth 4 (four) times daily. Patient not taking: Reported on 01/21/2019 08/15/17   Pleas Koch, NP  diazepam (VALIUM) 2 MG tablet Take 1 tablet by mouth 30 minutes prior to flights. Patient not taking: Reported on 01/14/2019 11/14/18   Pleas Koch, NP  fluticasone [facility]) 50 MCG/ACT nasal spray Place 1 spray into both nostrils 2 (two) times daily as needed for allergies or rhinitis. Patient not taking: Reported on 01/14/2019 07/28/17   Pleas Koch, NP  HYDROcodone-acetaminophen (NORCO/VICODIN) 5-325 MG tablet Take 1-2 tablets by mouth every 6 (six) hours as needed for moderate pain or severe pain. 01/21/19 01/21/20  Duffy Bruce, MD  ondansetron (ZOFRAN ODT) 4 MG disintegrating tablet Take 1 tablet (4 mg total) by mouth every 8 (eight) hours as needed for nausea or vomiting. 01/21/19   Duffy Bruce, MD  SUMAtriptan (IMITREX) 50 MG tablet Take 1 tablet by mouth at migraine onset. May repeat in 2 hours if headache persists or recurs. Do not exceed 2 tablets in 24 hours. Patient not  taking: Reported on 01/14/2019 04/10/17   Pleas Koch, NP    Allergies Esomeprazole magnesium, Penicillins, and Sulfonamide derivatives  Family History  Problem Relation Age of Onset  . Diabetes Brother   . Colon cancer Neg Hx   . Colon polyps Neg Hx   . Kidney disease Neg Hx     Social History Social History   Tobacco Use  . Smoking status: Current Every Day Smoker    Packs/day: 0.75    Years: 26.00    Pack years:  19.50    Types: Cigarettes  . Smokeless tobacco: Never Used  . Tobacco comment: pt given sheet  Substance Use Topics  . Alcohol use: Yes    Comment: Occassionally  . Drug use: No    Review of Systems  Review of Systems  Constitutional: Positive for fatigue. Negative for fever.  HENT: Negative for congestion and sore throat.   Eyes: Negative for visual disturbance.  Respiratory: Negative for cough and shortness of breath.   Cardiovascular: Negative for chest pain.  Gastrointestinal: Positive for nausea. Negative for abdominal pain, diarrhea and vomiting.  Genitourinary: Positive for dysuria and flank pain.  Musculoskeletal: Negative for back pain and neck pain.  Skin: Negative for rash and wound.  Neurological: Negative for weakness.  All other systems reviewed and are negative.    ____________________________________________  PHYSICAL EXAM:      VITAL SIGNS: ED Triage Vitals  Enc Vitals Group     BP 01/21/19 1841 136/78     Pulse Rate 01/21/19 1841 89     Resp 01/21/19 1841 20     Temp 01/21/19 1841 99.4 F (37.4 C)     Temp Source 01/21/19 1841 Oral     SpO2 01/21/19 1841 98 %     Weight 01/21/19 1841 108 lb (49 kg)     Height 01/21/19 1841 5\' 4"  (1.626 m)     Head Circumference --      Peak Flow --      Pain Score 01/21/19 1846 7     Pain Loc --      Pain Edu? --      Excl. in [place]? --      Physical Exam Vitals signs and nursing note reviewed.  Constitutional:      General: She is not in acute distress.    Appearance: She is well-developed.  HENT:     Head: Normocephalic and atraumatic.  Eyes:     Conjunctiva/sclera: Conjunctivae normal.  Neck:     Musculoskeletal: Neck supple.  Cardiovascular:     Rate and Rhythm: Normal rate and regular rhythm.     Heart sounds: Normal heart sounds. No murmur. No friction rub.  Pulmonary:     Effort: Pulmonary effort is normal. No respiratory distress.     Breath sounds: Normal breath sounds. No wheezing or rales.   Abdominal:     General: There is no distension.     Palpations: Abdomen is soft.     Tenderness: There is abdominal tenderness. There is right CVA tenderness.     Comments: Mild suprapubic and R CVAT  Skin:    General: Skin is warm.     Capillary Refill: Capillary refill takes less than 2 seconds.  Neurological:     Mental Status: She is alert and oriented to person, place, and time.     Motor: No abnormal muscle tone.       ____________________________________________   LABS (all labs ordered are listed, but only  abnormal results are displayed)  Labs Reviewed  COMPREHENSIVE METABOLIC PANEL - Abnormal; Notable for the following components:      Result Value   Glucose, Bld 143 (*)    All other components within normal limits  CBC - Abnormal; Notable for the following components:   WBC 11.0 (*)    All other components within normal limits  URINALYSIS, COMPLETE (UACMP) WITH MICROSCOPIC - Abnormal; Notable for the following components:   Color, Urine YELLOW (*)    APPearance HAZY (*)    Specific Gravity, Urine 1.002 (*)    Hgb urine dipstick SMALL (*)    Nitrite POSITIVE (*)    Leukocytes,Ua TRACE (*)    Bacteria, UA MANY (*)    All other components within normal limits  URINE CULTURE  LIPASE, BLOOD  POC URINE PREG, ED    ____________________________________________   ________________________________________  RADIOLOGY All imaging, including plain films, CT scans, and ultrasounds, independently reviewed by me, and interpretations confirmed via formal radiology reads.  ED MD interpretation:   CT Stone: No obstructing stones  Official radiology report(s): Ct Renal Stone Study  Result Date: 01/21/2019 CLINICAL DATA:  Right lower back pain. EXAM: CT ABDOMEN AND PELVIS WITHOUT CONTRAST TECHNIQUE: Multidetector CT imaging of the abdomen and pelvis was performed following the standard protocol without IV contrast. COMPARISON:  September 19, 2005. January 04, 2008. FINDINGS:  Lower chest: The lung bases are clear. The heart size is normal. Hepatobiliary: The liver is normal. Normal gallbladder.There is no biliary ductal dilation. Pancreas: Normal contours without ductal dilatation. No peripancreatic fluid collection. Spleen: No splenic laceration or hematoma. Adrenals/Urinary Tract: --Adrenal glands: No adrenal hemorrhage. --Right kidney/ureter: No hydronephrosis or perinephric hematoma. --Left kidney/ureter: No hydronephrosis or perinephric hematoma. --Urinary bladder: Unremarkable. Stomach/Bowel: --Stomach/Duodenum: No hiatal hernia or other gastric abnormality. Normal duodenal course and caliber. --Small bowel: No dilatation or inflammation. --Colon: No focal abnormality. --Appendix: Normal. Vascular/Lymphatic: Atherosclerotic calcification is present within the non-aneurysmal abdominal aorta, without hemodynamically significant stenosis. --No retroperitoneal lymphadenopathy. --No mesenteric lymphadenopathy. --No pelvic or inguinal lymphadenopathy. Reproductive: Unremarkable Other: No ascites or free air. The abdominal wall is normal. Musculoskeletal. No acute displaced fractures. IMPRESSION: No acute abdominopelvic abnormality. Aortic Atherosclerosis (ICD10-I70.0). Electronically Signed   By: Constance Holster M.D.   On: 01/21/2019 22:10    ____________________________________________  PROCEDURES   Procedure(s) performed (including Critical Care):  Procedures  ____________________________________________  INITIAL IMPRESSION / MDM / [place] / ED COURSE  As part of my medical decision making, I reviewed the following data within the [facility] notes reviewed and incorporated, Old chart reviewed, Notes from prior ED visits, and [place] Controlled Substance Database       *Konica Mcharg Cahall was evaluated in Emergency Department on 01/22/2019 for the symptoms described in the history of present illness. She was evaluated in the context of  the global COVID-19 pandemic, which necessitated consideration that the patient might be at risk for infection with the SARS-CoV-2 virus that causes COVID-19. Institutional protocols and algorithms that pertain to the evaluation of patients at risk for COVID-19 are in a state of rapid change based on information released by regulatory bodies including the CDC and federal and state organizations. These policies and algorithms were followed during the patient's care in the ED.  Some ED evaluations and interventions may be delayed as a result of limited staffing during the pandemic.*     Medical Decision Making:  53 yo F here with suprapubic pain, mild R  flank pain. She is afebrile and HDS. Tolerating PO. Labs are consistent with mild pyelonephritis with no signs of AKI or sepsis. CT stone study shows no obstructing stones or complications. No sx to suggest renal abscess. Pt given fluids, ABX here and is otherwise nontoxic. Given her allergies, will start on fluoroquinolone. Culture sent. D/c home, with good return precautions.  ____________________________________________  FINAL CLINICAL IMPRESSION(S) / ED DIAGNOSES  Final diagnoses:  Pyelonephritis     MEDICATIONS GIVEN DURING THIS VISIT:  Medications  sodium chloride flush (NS) 0.9 % injection 3 mL (3 mLs Intravenous Refused 01/21/19 2200)  morphine 4 MG/ML injection 4 mg (0 mg Intravenous Hold 01/21/19 2206)  ondansetron (ZOFRAN) injection 4 mg (0 mg Intravenous Hold 01/21/19 2206)  sodium chloride 0.9 % bolus 1,000 mL (0 mLs Intravenous Stopped 01/21/19 2251)  levofloxacin (LEVAQUIN) IVPB 750 mg (0 mg Intravenous Stopped 01/21/19 2343)  ketorolac (TORADOL) 30 MG/ML injection 15 mg (15 mg Intravenous Given 01/21/19 2335)     ED Discharge Orders         Ordered    ciprofloxacin (CIPRO) 500 MG tablet  2 times daily     01/21/19 2309    HYDROcodone-acetaminophen (NORCO/VICODIN) 5-325 MG tablet  Every 6 hours PRN     01/21/19 2309     ondansetron (ZOFRAN ODT) 4 MG disintegrating tablet  Every 8 hours PRN     01/21/19 2309           Note:  This document was prepared using Dragon voice recognition software and may include unintentional dictation errors.   Duffy Bruce, MD 01/22/19 [phone]

## 2019-01-21 NOTE — ED Triage Notes (Signed)
C/O right lower back pain that radiates to umbilicus.  Patient describes similar episode last week that resolved.  Seen by Chiropractor today, who referred patient to ED for evaluation.  Also c/o nausea.  Denies emesis.  Last BM 01/21/19.  States urine is concentrated and strong smelling.

## 2019-01-23 LAB — URINE CULTURE: Culture: >=100000 — AB

## 2019-02-18 DIAGNOSIS — M9901 Segmental and somatic dysfunction of cervical region: Secondary | ICD-10-CM | POA: Diagnosis not present

## 2019-02-18 DIAGNOSIS — M9902 Segmental and somatic dysfunction of thoracic region: Secondary | ICD-10-CM | POA: Diagnosis not present

## 2019-02-18 DIAGNOSIS — M542 Cervicalgia: Secondary | ICD-10-CM | POA: Diagnosis not present

## 2019-02-18 DIAGNOSIS — M9905 Segmental and somatic dysfunction of pelvic region: Secondary | ICD-10-CM | POA: Diagnosis not present

## 2019-03-04 DIAGNOSIS — M9905 Segmental and somatic dysfunction of pelvic region: Secondary | ICD-10-CM | POA: Diagnosis not present

## 2019-03-04 DIAGNOSIS — M9902 Segmental and somatic dysfunction of thoracic region: Secondary | ICD-10-CM | POA: Diagnosis not present

## 2019-03-04 DIAGNOSIS — M9901 Segmental and somatic dysfunction of cervical region: Secondary | ICD-10-CM | POA: Diagnosis not present

## 2019-03-04 DIAGNOSIS — M542 Cervicalgia: Secondary | ICD-10-CM | POA: Diagnosis not present

## 2019-03-11 DIAGNOSIS — M9905 Segmental and somatic dysfunction of pelvic region: Secondary | ICD-10-CM | POA: Diagnosis not present

## 2019-03-11 DIAGNOSIS — M9902 Segmental and somatic dysfunction of thoracic region: Secondary | ICD-10-CM | POA: Diagnosis not present

## 2019-03-11 DIAGNOSIS — M9901 Segmental and somatic dysfunction of cervical region: Secondary | ICD-10-CM | POA: Diagnosis not present

## 2019-03-11 DIAGNOSIS — M542 Cervicalgia: Secondary | ICD-10-CM | POA: Diagnosis not present

## 2019-03-25 DIAGNOSIS — M9905 Segmental and somatic dysfunction of pelvic region: Secondary | ICD-10-CM | POA: Diagnosis not present

## 2019-03-25 DIAGNOSIS — M9901 Segmental and somatic dysfunction of cervical region: Secondary | ICD-10-CM | POA: Diagnosis not present

## 2019-03-25 DIAGNOSIS — M542 Cervicalgia: Secondary | ICD-10-CM | POA: Diagnosis not present

## 2019-03-25 DIAGNOSIS — M9902 Segmental and somatic dysfunction of thoracic region: Secondary | ICD-10-CM | POA: Diagnosis not present

## 2019-04-02 DIAGNOSIS — M9901 Segmental and somatic dysfunction of cervical region: Secondary | ICD-10-CM | POA: Diagnosis not present

## 2019-04-02 DIAGNOSIS — M9902 Segmental and somatic dysfunction of thoracic region: Secondary | ICD-10-CM | POA: Diagnosis not present

## 2019-04-02 DIAGNOSIS — M9905 Segmental and somatic dysfunction of pelvic region: Secondary | ICD-10-CM | POA: Diagnosis not present

## 2019-04-02 DIAGNOSIS — M542 Cervicalgia: Secondary | ICD-10-CM | POA: Diagnosis not present

## 2019-04-22 DIAGNOSIS — M9905 Segmental and somatic dysfunction of pelvic region: Secondary | ICD-10-CM | POA: Diagnosis not present

## 2019-04-22 DIAGNOSIS — M542 Cervicalgia: Secondary | ICD-10-CM | POA: Diagnosis not present

## 2019-04-22 DIAGNOSIS — M9901 Segmental and somatic dysfunction of cervical region: Secondary | ICD-10-CM | POA: Diagnosis not present

## 2019-04-22 DIAGNOSIS — M9902 Segmental and somatic dysfunction of thoracic region: Secondary | ICD-10-CM | POA: Diagnosis not present

## 2019-05-20 DIAGNOSIS — M542 Cervicalgia: Secondary | ICD-10-CM | POA: Diagnosis not present

## 2019-05-20 DIAGNOSIS — M9905 Segmental and somatic dysfunction of pelvic region: Secondary | ICD-10-CM | POA: Diagnosis not present

## 2019-05-20 DIAGNOSIS — M9902 Segmental and somatic dysfunction of thoracic region: Secondary | ICD-10-CM | POA: Diagnosis not present

## 2019-05-20 DIAGNOSIS — M9901 Segmental and somatic dysfunction of cervical region: Secondary | ICD-10-CM | POA: Diagnosis not present

## 2019-10-01 ENCOUNTER — Other Ambulatory Visit: Payer: Self-pay

## 2019-10-01 DIAGNOSIS — IMO0002 Reserved for concepts with insufficient information to code with codable children: Secondary | ICD-10-CM

## 2019-10-01 DIAGNOSIS — G43709 Chronic migraine without aura, not intractable, without status migrainosus: Secondary | ICD-10-CM

## 2019-10-01 MED ORDER — ATENOLOL 25 MG PO TABS: 25.0000 mg | ORAL_TABLET | Freq: Two times a day (BID) | ORAL | 0 refills | Status: DC
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
## 2019-10-01 NOTE — Telephone Encounter (Signed)
Noted. Based off of chart review, note from July 2018, she takes atenolol for migraine prevention. She originally received this medication from the headache center in [place].  Agree to refill.

## 2019-12-12 ENCOUNTER — Other Ambulatory Visit: Payer: Self-pay | Admitting: Obstetrics & Gynecology

## 2019-12-12 NOTE — Telephone Encounter (Signed)
Sch annual in [facility]

## 2019-12-13 ENCOUNTER — Other Ambulatory Visit: Payer: Self-pay | Admitting: Obstetrics & Gynecology

## 2019-12-13 MED ORDER — NORETHINDRONE ACETATE 5 MG PO TABS: 5.0000 mg | ORAL_TABLET | Freq: Every day | ORAL | 0 refills | Status: DC

## 2019-12-16 ENCOUNTER — Other Ambulatory Visit: Payer: Self-pay

## 2019-12-16 MED ORDER — NORETHINDRONE ACETATE 5 MG PO TABS: 5.0000 mg | ORAL_TABLET | Freq: Every day | ORAL | 0 refills | Status: DC

## 2019-12-16 NOTE — Telephone Encounter (Signed)
Refilled pt RX to last to her appt with [facility]

## 2019-12-17 ENCOUNTER — Other Ambulatory Visit: Payer: Self-pay | Admitting: Obstetrics & Gynecology

## 2019-12-29 ENCOUNTER — Other Ambulatory Visit: Payer: Self-pay | Admitting: Primary Care

## 2019-12-29 DIAGNOSIS — G43709 Chronic migraine without aura, not intractable, without status migrainosus: Secondary | ICD-10-CM

## 2020-01-20 ENCOUNTER — Encounter: Payer: Self-pay | Admitting: Obstetrics & Gynecology

## 2020-01-20 ENCOUNTER — Ambulatory Visit (INDEPENDENT_AMBULATORY_CARE_PROVIDER_SITE_OTHER): Payer: Medicaid - Out of State | Admitting: Obstetrics & Gynecology

## 2020-01-20 ENCOUNTER — Other Ambulatory Visit: Payer: Self-pay

## 2020-01-20 VITALS — BP 120/80 | Ht 64.0 in | Wt 213.0 lb

## 2020-01-20 DIAGNOSIS — Z1231 Encounter for screening mammogram for malignant neoplasm of breast: Secondary | ICD-10-CM | POA: Diagnosis not present

## 2020-01-20 DIAGNOSIS — Z1211 Encounter for screening for malignant neoplasm of colon: Secondary | ICD-10-CM | POA: Diagnosis not present

## 2020-01-20 DIAGNOSIS — Z780 Asymptomatic menopausal state: Secondary | ICD-10-CM | POA: Diagnosis not present

## 2020-01-20 DIAGNOSIS — Z01419 Encounter for gynecological examination (general) (routine) without abnormal findings: Secondary | ICD-10-CM | POA: Diagnosis not present

## 2020-01-20 DIAGNOSIS — Z8041 Family history of malignant neoplasm of ovary: Secondary | ICD-10-CM

## 2020-01-20 DIAGNOSIS — Z803 Family history of malignant neoplasm of breast: Secondary | ICD-10-CM

## 2020-01-20 MED ORDER — CLOBETASOL PROPIONATE 0.05 % EX OINT: TOPICAL_OINTMENT | CUTANEOUS | 5 refills | Status: DC

## 2020-01-20 NOTE — Patient Instructions (Signed)
PAP every three years Mammogram every year    Call [phone] to schedule at [facility] Colonoscopy every 10 years Labs yearly (with PCP)  Thank you for choosing [facility]. As part of our ongoing efforts to improve patient experience, we would appreciate your feedback. Please fill out the short survey that you will receive by mail or MyChart. Your opinion is important to [place]! - Dr. Kenton Kingfisher

## 2020-01-20 NOTE — Addendum Note (Signed)
Addended by: Gae Dry on: 01/20/2020 03:59 PM   Modules accepted: Orders

## 2020-01-20 NOTE — Progress Notes (Signed)
HPI:      Ms. [name] [name] is a 54 y.o. G1P1001 who LMP was in the past, she presents today for her annual examination.  The patient has no complaints today. No period >1 year.  The patient is sexually active. Herlast pap: approximate date 2019 and was normal and last mammogram: was normal.  The patient does perform self breast exams.  There is notable family history of breast or ovarian cancer in her family. The patient is taking hormone replacement therapy. Patient denies post-menopausal vaginal bleeding.   The patient has regular exercise: yes. The patient denies current symptoms of depression.    GYN Hx: Last Colonoscopy:never ago. Normal.   PMHx: Past Medical History:  Diagnosis Date  . GERD (gastroesophageal reflux disease)   . Migraine headache   . Osteoarthritis   . Palpitations   . Premenstrual dysphoria   . Viral syndrome    Past Surgical History:  Procedure Laterality Date  . CESAREAN SECTION    . OVARIAN CYST SURGERY     Family History  Problem Relation Age of Onset  . Diabetes Brother   . Colon cancer Neg Hx   . Colon polyps Neg Hx   . Kidney disease Neg Hx    Social History   Tobacco Use  . Smoking status: Current Every Day Smoker    Packs/day: 0.75    Years: 26.00    Pack years: 19.50    Types: Cigarettes  . Smokeless tobacco: Never Used  . Tobacco comment: pt given sheet  Vaping Use  . Vaping Use: Never used  Substance Use Topics  . Alcohol use: Yes    Comment: Occassionally  . Drug use: No    Current Outpatient Medications:  .  atenolol (TENORMIN) 25 MG tablet, TAKE 1 TABLET (25 MG TOTAL) BY MOUTH 2 (TWO) TIMES DAILY. FOR HEADACHE PREVENTION., Disp: 180 tablet, Rfl: 0 Allergies: Esomeprazole magnesium, Penicillins, and Sulfonamide derivatives  Review of Systems  Constitutional: Negative for chills, fever and malaise/fatigue.  HENT: Negative for congestion, sinus pain and sore throat.   Eyes: Negative for blurred vision and pain.  Respiratory:  Negative for cough and wheezing.   Cardiovascular: Negative for chest pain and leg swelling.  Gastrointestinal: Negative for abdominal pain, constipation, diarrhea, heartburn, nausea and vomiting.  Genitourinary: Negative for dysuria, frequency, hematuria and urgency.  Musculoskeletal: Negative for back pain, joint pain, myalgias and neck pain.  Skin: Negative for itching and rash.  Neurological: Negative for dizziness, tremors and weakness.  Endo/Heme/Allergies: Does not bruise/bleed easily.  Psychiatric/Behavioral: Negative for depression. The patient is not nervous/anxious and does not have insomnia.     Objective: BP 120/80   Ht 5' 4"  (1.626 m)   Wt 213 lb (96.6 kg)   BMI 36.56 kg/m   Filed Weights   01/20/20 0953  Weight: 213 lb (96.6 kg)   Body mass index is 36.56 kg/m. Physical Exam Constitutional:      General: She is not in acute distress.    Appearance: She is well-developed.  Genitourinary:     Pelvic exam was performed with patient supine.     Vagina, uterus and rectum normal.     No lesions in the vagina.     No vaginal bleeding.     No cervical motion tenderness, friability, lesion or polyp.     Uterus is mobile.     Uterus is not enlarged.     No uterine mass detected.    Uterus is midaxial.  No right or left adnexal mass present.     Right adnexa not tender.     Left adnexa not tender.  HENT:     Head: Normocephalic and atraumatic. No laceration.     Right Ear: Hearing normal.     Left Ear: Hearing normal.     Mouth/Throat:     Pharynx: Uvula midline.  Eyes:     Pupils: Pupils are equal, round, and reactive to light.  Neck:     Thyroid: No thyromegaly.  Cardiovascular:     Rate and Rhythm: Normal rate and regular rhythm.     Heart sounds: No murmur heard.  No friction rub. No gallop.   Pulmonary:     Effort: Pulmonary effort is normal. No respiratory distress.     Breath sounds: Normal breath sounds. No wheezing.  Chest:     Breasts:         Right: No mass, skin change or tenderness.        Left: No mass, skin change or tenderness.  Abdominal:     General: Bowel sounds are normal. There is no distension.     Palpations: Abdomen is soft.     Tenderness: There is no abdominal tenderness. There is no rebound.  Musculoskeletal:        General: Normal range of motion.     Cervical back: Normal range of motion and neck supple.  Neurological:     Mental Status: She is alert and oriented to person, place, and time.     Cranial Nerves: No cranial nerve deficit.  Skin:    General: Skin is warm and dry.  Psychiatric:        Judgment: Judgment normal.  Vitals reviewed.    Assessment: Annual Exam 1. Women's annual routine gynecological examination   2. Menopause   3. Encounter for screening mammogram for malignant neoplasm of breast   4. Screen for colon cancer   5. Family history of ovarian cancer   6. Family history of breast cancer    Plan:            1.  Cervical Screening-  Pap smear schedule reviewed with patient  2. Breast screening- Exam annually and mammogram scheduled  3. Colonoscopy every 10 years, Hemoccult testing after age 71  4. Labs managed by PCP  5. Counseling for hormonal therapy: wants to stop due to no further bleeding or periods.  Will monitor for hot flashes and night sweats.   6.  She presents with a significant personal and/or family history of breast and ovarian. Details of which can be found in her medical/family history. She does not have a previously identified BRCA and Lynch syndrome mutation in her family. Due to her personal and/or family history of cancer she is a candidate for the [facility] test(s).    Risk for cancer, genetic susceptibility discussed.  Patient has undecided gene testing.  Discussed BRCA as well as Lynch syndrome and other cancer risk assessments available based on her family history and personal history. Pros and cons of testing discussed.     F/U  Return in about 1 year  (around 01/19/2021) for Annual.  Barnett Applebaum, MD, [facility], [facility] 01/20/2020  10:52 AM

## 2020-04-16 ENCOUNTER — Telehealth: Payer: Self-pay

## 2020-04-16 NOTE — Telephone Encounter (Signed)
Left voice message to advise pt to schedule her mammogram

## 2020-04-16 NOTE — Telephone Encounter (Signed)
-----   Message from Gae Dry, MD sent at 04/16/2020  8:02 AM EST ----- Regarding: MMG Received notice she has not received MMG yet as ordered at her Annual. Please check and encourage her to do this, and document conversation.

## 2020-04-28 ENCOUNTER — Encounter: Payer: Self-pay | Admitting: Primary Care

## 2020-04-28 ENCOUNTER — Ambulatory Visit (INDEPENDENT_AMBULATORY_CARE_PROVIDER_SITE_OTHER): Payer: PRIVATE HEALTH INSURANCE | Admitting: Primary Care

## 2020-04-28 ENCOUNTER — Other Ambulatory Visit: Payer: Self-pay

## 2020-04-28 VITALS — BP 118/74 | HR 78 | Temp 97.8°F | Ht 64.0 in | Wt 121.8 lb

## 2020-04-28 DIAGNOSIS — N924 Excessive bleeding in the premenopausal period: Secondary | ICD-10-CM

## 2020-04-28 DIAGNOSIS — Z0000 Encounter for general adult medical examination without abnormal findings: Secondary | ICD-10-CM | POA: Diagnosis not present

## 2020-04-28 DIAGNOSIS — Z23 Encounter for immunization: Secondary | ICD-10-CM

## 2020-04-28 DIAGNOSIS — G43709 Chronic migraine without aura, not intractable, without status migrainosus: Secondary | ICD-10-CM | POA: Diagnosis not present

## 2020-04-28 DIAGNOSIS — J309 Allergic rhinitis, unspecified: Secondary | ICD-10-CM | POA: Diagnosis not present

## 2020-04-28 DIAGNOSIS — L237 Allergic contact dermatitis due to plants, except food: Secondary | ICD-10-CM

## 2020-04-28 DIAGNOSIS — Z1211 Encounter for screening for malignant neoplasm of colon: Secondary | ICD-10-CM | POA: Diagnosis not present

## 2020-04-28 DIAGNOSIS — K219 Gastro-esophageal reflux disease without esophagitis: Secondary | ICD-10-CM

## 2020-04-28 DIAGNOSIS — G8929 Other chronic pain: Secondary | ICD-10-CM

## 2020-04-28 DIAGNOSIS — F32A Depression, unspecified: Secondary | ICD-10-CM

## 2020-04-28 DIAGNOSIS — M549 Dorsalgia, unspecified: Secondary | ICD-10-CM

## 2020-04-28 DIAGNOSIS — Z803 Family history of malignant neoplasm of breast: Secondary | ICD-10-CM

## 2020-04-28 LAB — COMPREHENSIVE METABOLIC PANEL
ALT: 12 U/L (ref 0–35)
AST: 15 U/L (ref 0–37)
Albumin: 4.5 g/dL (ref 3.5–5.2)
Alkaline Phosphatase: 76 U/L (ref 39–117)
BUN: 11 mg/dL (ref 6–23)
CO2: 29 mEq/L (ref 19–32)
Calcium: 9.5 mg/dL (ref 8.4–10.5)
Chloride: 104 mEq/L (ref 96–112)
Creatinine, Ser: 0.84 mg/dL (ref 0.40–1.20)
GFR: 79.67 mL/min (ref >60.00)
Glucose, Bld: 86 mg/dL (ref 70–99)
Potassium: 3.9 mEq/L (ref 3.5–5.1)
Sodium: 139 mEq/L (ref 135–145)
Total Bilirubin: 0.8 mg/dL (ref 0.2–1.2)
Total Protein: 7.3 g/dL (ref 6.0–8.3)

## 2020-04-28 LAB — CBC
HCT: 42 % (ref 36.0–46.0)
Hemoglobin: 14.4 g/dL (ref 12.0–15.0)
MCHC: 34.2 g/dL (ref 30.0–36.0)
MCV: 94.4 fl (ref 78.0–100.0)
Platelets: 294 10*3/uL (ref 150.0–400.0)
RBC: 4.44 Mil/uL (ref 3.87–5.11)
RDW: 13 % (ref 11.5–15.5)
WBC: 6.2 10*3/uL (ref 4.0–10.5)

## 2020-04-28 LAB — LIPID PANEL
Cholesterol: 214 mg/dL — ABNORMAL HIGH (ref 0–200)
HDL: 33.5 mg/dL — ABNORMAL LOW (ref >39.00)
LDL Cholesterol: 158 mg/dL — ABNORMAL HIGH (ref 0–99)
NonHDL: 180.43
Total CHOL/HDL Ratio: 6
Triglycerides: 110 mg/dL (ref 0.0–149.0)
VLDL: 22 mg/dL (ref 0.0–40.0)

## 2020-04-28 MED ORDER — CLOBETASOL PROPIONATE 0.05 % EX CREA: 1.0000 "application " | TOPICAL_CREAM | Freq: Two times a day (BID) | CUTANEOUS | 0 refills | Status: AC

## 2020-04-28 NOTE — Assessment & Plan Note (Signed)
Intermittent, mostly during spring and summer months. She prefers cream versus ointment, switch prescription per patient preference.

## 2020-04-28 NOTE — Assessment & Plan Note (Signed)
Stable, no concerns today. Continue to monitor.

## 2020-04-28 NOTE — Addendum Note (Signed)
Addended by: Francella Solian on: 04/28/2020 12:26 PM   Modules accepted: Orders

## 2020-04-28 NOTE — Assessment & Plan Note (Signed)
Denies heartburn symptoms.  Avoids triggers.

## 2020-04-28 NOTE — Assessment & Plan Note (Addendum)
Tetanus, pneumonia, and Shingrix due.  Today she elects for Pneumovax 23 given smoking history.  Pap smear UTD per patient, follows with GYN. Mammogram overdue, ordered in Epic per GYN, discussed to follow through with imaging.   Colonoscopy overdue, referral placed again in Epic.   Encouraged a healthy diet and exercise. Encouraged to stop smoking. Exam today stable. Labs pending.

## 2020-04-28 NOTE — Progress Notes (Signed)
Subjective:    Patient ID: [name] [name], female    DOB: 1965/08/05, 55 y.o.   MRN: [id number]  HPI   This visit occurred during the SARS-CoV-2 public health emergency.  Safety protocols were in place, including screening questions prior to the visit, additional usage of staff PPE, and extensive cleaning of exam room while observing appropriate contact time as indicated for disinfecting solutions.   [name] [name] is a 55 year old female who presents today for complete physical.  Immunizations: -Tetanus: Over 10 years ago. -Influenza: Due, declines  -Shingles: Never completed  -Pneumonia: Never completed -Covid-19: Completed 2 doses   Diet: She endorses a fair diet.  Exercise: Active at home.  Eye exam: No recent exam Dental exam: Completed last week.  Pap Smear: November 2021 Mammogram: Due, has not completed, ordered by GYN Colonoscopy: Never completed, referred by GYN and never followed through. Hep C Screen: Due  BP Readings from Last 3 Encounters:  04/28/20 118/74  01/20/20 120/80  01/21/19 137/79      Review of Systems  Constitutional: Negative for unexpected weight change.  HENT: Negative for rhinorrhea.   Eyes: Negative for visual disturbance.  Respiratory: Negative for cough and shortness of breath.   Cardiovascular: Negative for chest pain.  Gastrointestinal: Negative for constipation and diarrhea.  Genitourinary: Negative for difficulty urinating.  Musculoskeletal: Positive for arthralgias.  Skin: Negative for rash.  Allergic/Immunologic: Positive for environmental allergies.  Neurological: Negative for dizziness and headaches.  Psychiatric/Behavioral: The patient is not nervous/anxious.        Past Medical History:  Diagnosis Date  . GERD (gastroesophageal reflux disease)   . Migraine headache   . Osteoarthritis   . Palpitations   . Premenstrual dysphoria   . Viral syndrome      Social History   Socioeconomic History  . Marital status: Married     Spouse name: Not on file  . Number of children: 1  . Years of education: Not on file  . Highest education level: Not on file  Occupational History  . Occupation: FARMER    Employer: SELF EMPLOYED  Tobacco Use  . Smoking status: Current Every Day Smoker    Packs/day: 0.75    Years: 26.00    Pack years: 19.50    Types: Cigarettes  . Smokeless tobacco: Never Used  . Tobacco comment: pt given sheet  Vaping Use  . Vaping Use: Never used  Substance and Sexual Activity  . Alcohol use: Yes    Comment: Occassionally  . Drug use: No  . Sexual activity: Yes  Other Topics Concern  . Not on file  Social History Narrative  . Not on file   Social Determinants of Health   Financial Resource Strain: Not on file  Food Insecurity: Not on file  Transportation Needs: Not on file  Physical Activity: Not on file  Stress: Not on file  Social Connections: Not on file  Intimate Partner Violence: Not on file    Past Surgical History:  Procedure Laterality Date  . CESAREAN SECTION    . OVARIAN CYST SURGERY      Family History  Problem Relation Age of Onset  . Diabetes Brother   . Colon cancer Neg Hx   . Colon polyps Neg Hx   . Kidney disease Neg Hx     Allergies  Allergen Reactions  . Esomeprazole Magnesium   . Penicillins   . Sulfonamide Derivatives     Current Outpatient Medications on File Prior to  Visit  Medication Sig Dispense Refill  . atenolol (TENORMIN) 25 MG tablet TAKE 1 TABLET (25 MG TOTAL) BY MOUTH 2 (TWO) TIMES DAILY. FOR HEADACHE PREVENTION. 180 tablet 0   No current facility-administered medications on file prior to visit.    BP 118/74   Pulse 78   Temp 97.8 F (36.6 C) (Temporal)   Ht 5\' 4"  (1.626 m)   Wt 121 lb 12 oz (55.2 kg)   LMP 02/27/2019 (Approximate)   SpO2 98%   BMI 20.90 kg/m    Objective:   Physical Exam Constitutional:      Appearance: She is well-nourished.  HENT:     Right Ear: Tympanic membrane and ear canal normal.     Left  Ear: Tympanic membrane and ear canal normal.     Mouth/Throat:     Mouth: Oropharynx is clear and moist.  Eyes:     Extraocular Movements: EOM normal.     Pupils: Pupils are equal, round, and reactive to light.  Cardiovascular:     Rate and Rhythm: Normal rate and regular rhythm.  Pulmonary:     Effort: Pulmonary effort is normal.     Breath sounds: Normal breath sounds.  Abdominal:     General: Bowel sounds are normal.     Palpations: Abdomen is soft.     Tenderness: There is no abdominal tenderness.  Musculoskeletal:        General: Normal range of motion.     Cervical back: Neck supple.  Skin:    General: Skin is warm and dry.  Neurological:     Mental Status: She is alert and oriented to person, place, and time.     Cranial Nerves: No cranial nerve deficit.     Deep Tendon Reflexes:     Reflex Scores:      Patellar reflexes are 2+ on the right side and 2+ on the left side. Psychiatric:        Mood and Affect: Mood and affect and mood normal.            Assessment & Plan:

## 2020-04-28 NOTE — Assessment & Plan Note (Signed)
Follows with GYN, no bleeding in one year. Strongly advised she proceed with mammogram and colonoscopy as ordered.

## 2020-04-28 NOTE — Patient Instructions (Signed)
Stop by the lab prior to leaving today. I will notify you of your results once received.   Continue to work on a healthy diet and regular activity.  You can schedule nurse visits for the shingles shot and tetanus shot.  Please call to schedule your mammogram as discussed.  You will be contacted regarding your referral to GI for the colonoscopy.  Please let us know if you have not been contacted within two weeks.   It was a pleasure to see you today!   Preventive Care 53-71 Years Old, Female Preventive care refers to lifestyle choices and visits with your health care provider that can promote health and wellness. This includes:  A yearly physical exam. This is also called an annual wellness visit.  Regular dental and eye exams.  Immunizations.  Screening for certain conditions.  Healthy lifestyle choices, such as: ? Eating a healthy diet. ? Getting regular exercise. ? Not using drugs or products that contain nicotine and tobacco. ? Limiting alcohol use. What can I expect for my preventive care visit? Physical exam Your health care provider will check your:  Height and weight. These may be used to calculate your BMI (body mass index). BMI is a measurement that tells if you are at a healthy weight.  Heart rate and blood pressure.  Body temperature.  Skin for abnormal spots. Counseling Your health care provider may ask you questions about your:  Past medical problems.  Family's medical history.  Alcohol, tobacco, and drug use.  Emotional well-being.  Home life and relationship well-being.  Sexual activity.  Diet, exercise, and sleep habits.  Work and work Statistician.  Access to firearms.  Method of birth control.  Menstrual cycle.  Pregnancy history. What immunizations do I need? Vaccines are usually given at various ages, according to a schedule. Your health care provider will recommend vaccines for you based on your age, medical history, and lifestyle  or other factors, such as travel or where you work.   What tests do I need? Blood tests  Lipid and cholesterol levels. These may be checked every 5 years, or more often if you are over 53 years old.  Hepatitis C test.  Hepatitis B test. Screening  Lung cancer screening. You may have this screening every year starting at age 72 if you have a 30-pack-year history of smoking and currently smoke or have quit within the past 15 years.  Colorectal cancer screening. ? All adults should have this screening starting at age 56 and continuing until age 87. ? Your health care provider may recommend screening at age 19 if you are at increased risk. ? You will have tests every 1-10 years, depending on your results and the type of screening test.  Diabetes screening. ? This is done by checking your blood sugar (glucose) after you have not eaten for a while (fasting). ? You may have this done every 1-3 years.  Mammogram. ? This may be done every 1-2 years. ? Talk with your health care provider about when you should start having regular mammograms. This may depend on whether you have a family history of breast cancer.  BRCA-related cancer screening. This may be done if you have a family history of breast, ovarian, tubal, or peritoneal cancers.  Pelvic exam and Pap test. ? This may be done every 3 years starting at age 53. ? Starting at age 80, this may be done every 5 years if you have a Pap test in combination with an HPV  test. Other tests  STD (sexually transmitted disease) testing, if you are at risk.  Bone density scan. This is done to screen for osteoporosis. You may have this scan if you are at high risk for osteoporosis. Talk with your health care provider about your test results, treatment options, and if necessary, the need for more tests. Follow these instructions at home: Eating and drinking  Eat a diet that includes fresh fruits and vegetables, whole grains, lean protein, and  low-fat dairy products.  Take vitamin and mineral supplements as recommended by your health care provider.  Do not drink alcohol if: ? Your health care provider tells you not to drink. ? You are pregnant, may be pregnant, or are planning to become pregnant.  If you drink alcohol: ? Limit how much you have to 0-1 drink a day. ? Be aware of how much alcohol is in your drink. In the [place] one drink equals one 12 oz bottle of beer (355 mL), one 5 oz glass of wine (148 mL), or one 1 oz glass of hard liquor (44 mL).   Lifestyle  Take daily care of your teeth and gums. Brush your teeth every morning and night with fluoride toothpaste. Floss one time each day.  Stay active. Exercise for at least 30 minutes 5 or more days each week.  Do not use any products that contain nicotine or tobacco, such as cigarettes, e-cigarettes, and chewing tobacco. If you need help quitting, ask your health care provider.  Do not use drugs.  If you are sexually active, practice safe sex. Use a condom or other form of protection to prevent STIs (sexually transmitted infections).  If you do not wish to become pregnant, use a form of birth control. If you plan to become pregnant, see your health care provider for a prepregnancy visit.  If told by your health care provider, take low-dose aspirin daily starting at age 43.  Find healthy ways to cope with stress, such as: ? Meditation, yoga, or listening to music. ? Journaling. ? Talking to a trusted person. ? Spending time with friends and family. Safety  Always wear your seat belt while driving or riding in a vehicle.  Do not drive: ? If you have been drinking alcohol. Do not ride with someone who has been drinking. ? When you are tired or distracted. ? While texting.  Wear a helmet and other protective equipment during sports activities.  If you have firearms in your house, make sure you follow all gun safety procedures. What's next?  Visit your health  care provider once a year for an annual wellness visit.  Ask your health care provider how often you should have your eyes and teeth checked.  Stay up to date on all vaccines. This information is not intended to replace advice given to you by your health care provider. Make sure you discuss any questions you have with your health care provider. Document Revised: 11/19/2019 Document Reviewed: 10/26/2017 Elsevier Patient Education  2021 [employer].

## 2020-04-28 NOTE — Assessment & Plan Note (Signed)
Intermittent and seasonal, continue to monitor.

## 2020-04-28 NOTE — Assessment & Plan Note (Signed)
Strongly advised she proceed with mammogram as ordered. She is aware that she needs to call and schedule.

## 2020-04-28 NOTE — Assessment & Plan Note (Signed)
Chronic, no longer following with headache center. Doing well on atenolol 25 mg BID, continue same.  Refills provided.

## 2020-04-28 NOTE — Assessment & Plan Note (Signed)
Improved since separation from her husband.  Denies concerns today. Continue to monitor.

## 2020-04-29 ENCOUNTER — Encounter: Payer: Self-pay | Admitting: *Deleted

## 2020-05-01 DIAGNOSIS — E785 Hyperlipidemia, unspecified: Secondary | ICD-10-CM

## 2020-05-01 MED ORDER — ATORVASTATIN CALCIUM 20 MG PO TABS: 20.0000 mg | ORAL_TABLET | Freq: Every day | ORAL | 3 refills | Status: DC

## 2020-05-26 ENCOUNTER — Telehealth: Payer: Self-pay

## 2020-05-26 NOTE — Telephone Encounter (Signed)
Left message to remind pt to schedule mammogram.

## 2020-07-08 ENCOUNTER — Encounter: Payer: Self-pay | Admitting: Family Medicine

## 2020-07-08 ENCOUNTER — Other Ambulatory Visit: Payer: Self-pay

## 2020-07-08 ENCOUNTER — Ambulatory Visit (INDEPENDENT_AMBULATORY_CARE_PROVIDER_SITE_OTHER): Payer: PRIVATE HEALTH INSURANCE | Admitting: Family Medicine

## 2020-07-08 VITALS — BP 126/86 | HR 83 | Temp 96.9°F | Ht 64.0 in | Wt 118.2 lb

## 2020-07-08 DIAGNOSIS — W57XXXA Bitten or stung by nonvenomous insect and other nonvenomous arthropods, initial encounter: Secondary | ICD-10-CM

## 2020-07-08 DIAGNOSIS — L237 Allergic contact dermatitis due to plants, except food: Secondary | ICD-10-CM | POA: Diagnosis not present

## 2020-07-08 DIAGNOSIS — S70361A Insect bite (nonvenomous), right thigh, initial encounter: Secondary | ICD-10-CM

## 2020-07-08 HISTORY — DX: Insect bite (nonvenomous), right thigh, initial encounter: W57.XXXA

## 2020-07-08 HISTORY — DX: Bitten or stung by nonvenomous insect and other nonvenomous arthropods, initial encounter: S70.361A

## 2020-07-08 MED ORDER — PREDNISONE 10 MG PO TABS: ORAL_TABLET | ORAL | 0 refills | Status: DC

## 2020-07-08 NOTE — Progress Notes (Signed)
Subjective:    Patient ID: [name] [name], female    DOB: [birth year], 55 y.o.   MRN: [id number]  This visit occurred during the SARS-CoV-2 public health emergency.  Safety protocols were in place, including screening questions prior to the visit, additional usage of staff PPE, and extensive cleaning of exam room while observing appropriate contact time as indicated for disinfecting solutions.    HPI Pt presents for rash/ ? 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 Tick bite of right thigh 07/08/2020  . Poison ivy dermatitis 04/28/2020  . Family history of ovarian cancer 01/20/2020  . Menopause 01/20/2020  . Family history of breast cancer 01/20/2020  . Perimenopausal menorrhagia 11/14/2018  . Depression 11/14/2018  . Fear of flying 11/14/2018  . Internal nasal lesion 05/01/2018  . Preventative health care 04/10/2017  . Chronic back pain 09/27/2016  . Chronic shoulder pain 09/27/2016  . Chronic neck pain 09/27/2016  . ABDOMINAL PAIN-MULTIPLE SITES 02/15/2010  . NICOTINE ADDICTION 07/31/2009  . Migraines 03/22/2007  . MITRAL VALVE PROLAPSE 03/22/2007  . Allergic rhinitis 03/22/2007  . G E R D  03/22/2007   Past Medical History:  Diagnosis Date  . GERD (gastroesophageal reflux disease)   . Migraine headache   . Osteoarthritis   . Palpitations   . Premenstrual dysphoria   . Viral syndrome    Past Surgical History:  Procedure Laterality Date  . CESAREAN SECTION    . OVARIAN CYST SURGERY     Social History   Tobacco Use  . Smoking status: Current Every Day Smoker    Packs/day: 0.75    Years: 26.00    Pack years: 19.50    Types: Cigarettes  . Smokeless tobacco: Never Used  . Tobacco comment: pt given sheet  Vaping Use  . Vaping Use: Never used  Substance Use Topics  . Alcohol use: Yes    Comment: Occassionally  . Drug use: No   Family History  Problem Relation Age of Onset  . Diabetes Brother   . Colon cancer Neg Hx   . Colon polyps Neg Hx   . Kidney disease Neg Hx    Allergies  Allergen Reactions  . Esomeprazole Magnesium   . Penicillins   . Sulfonamide Derivatives    Current Outpatient Medications on File Prior to Visit  Medication Sig Dispense Refill  . atenolol (TENORMIN) 25 MG tablet TAKE 1 TABLET (25 MG TOTAL) BY MOUTH 2 (TWO) TIMES DAILY. FOR HEADACHE PREVENTION. 180 tablet 0  . clobetasol cream (  TEMOVATE) 6.23 % Apply 1 application topically 2 (two) times daily. 30 g 0   No current facility-administered medications on file prior to visit.    Review of Systems  Constitutional: Negative for activity change, appetite change, fatigue, fever and unexpected weight change.  HENT: Negative for congestion, ear pain, rhinorrhea, sinus pressure and sore throat.   Eyes: Negative for pain, redness and visual disturbance.  Respiratory: Negative for cough, shortness of breath and wheezing.   Cardiovascular: Negative for chest pain and palpitations.  Gastrointestinal: Negative for abdominal pain, blood in stool, constipation and diarrhea.  Endocrine: Negative for polydipsia and polyuria.  Genitourinary: Negative for dysuria, frequency and urgency.   Musculoskeletal: Negative for arthralgias, back pain and myalgias.  Skin: Positive for rash. Negative for pallor.  Allergic/Immunologic: Negative for environmental allergies.  Neurological: Negative for dizziness, syncope and headaches.  Hematological: Negative for adenopathy. Does not bruise/bleed easily.  Psychiatric/Behavioral: Negative for decreased concentration and dysphoric mood. The patient is not nervous/anxious.        Objective:   Physical Exam Constitutional:      General: She is not in acute distress.    Appearance: Normal appearance. She is normal weight. She is not ill-appearing.  Eyes:     General:        Right eye: No discharge.        Left eye: No discharge.     Conjunctiva/sclera: Conjunctivae normal.     Pupils: Pupils are equal, round, and reactive to light.  Cardiovascular:     Rate and Rhythm: Normal rate and regular rhythm.  Pulmonary:     Breath sounds: No wheezing.  Musculoskeletal:     Cervical back: Neck supple.  Lymphadenopathy:     Cervical: No cervical adenopathy.  Skin:    General: Skin is warm and dry.     Findings: Rash present.     Comments: Vesicular rash in linear pattern and patches - all extremities and some on upper abdomen and neck  No weeping  No pustules   2-3 mm scab with pink color on R upper thigh consistent with recent tick bite No residual tick parts seen  Neurological:     Mental Status: She is alert.     Cranial Nerves: No cranial nerve deficit.  Psychiatric:        Mood and Affect: Mood normal.           Assessment & Plan:   Problem List Items Addressed This Visit      Musculoskeletal and Integument   Poison ivy dermatitis - Primary    S/p work outdoors clearing brush  Severely itchy  Px prednisone and disc side eff zyrtec10 mg daily - recommend  Keep cool Cut nails short and avoid scratching        Tick bite of right thigh    Slightly red scab- no bullseye shape  Current rash is consistent with  poison ivy/ not tick illness Per pt- thinks it was a lone star tick  Disc need to obs for changes in shape/size Also for fever/ joint pain  Will update

## 2020-07-08 NOTE — Assessment & Plan Note (Signed)
S/p work outdoors clearing brush  Severely itchy  Px prednisone and disc side eff zyrtec10 mg daily - recommend  Keep cool Cut nails short and avoid scratching

## 2020-07-08 NOTE — Patient Instructions (Addendum)
Keep cool (warm conditions make itching worse)   Take prednisone as directed  Get zyrtec 10 mg over the counter and take one daily for itch  Keep rash clean and try not to scratch  You may need to cut finger nails short  Wash everything that contacted plants and wipe off shoes or boots  Watch tick bite for any changes or fever or new symptoms

## 2020-07-08 NOTE — Assessment & Plan Note (Signed)
Slightly red scab- no bullseye shape  Current rash is consistent with poison ivy/ not tick illness Per pt- thinks it was a lone star tick  Disc need to obs for changes in shape/size Also for fever/ joint pain  Will update

## 2020-07-09 ENCOUNTER — Other Ambulatory Visit: Payer: Self-pay | Admitting: Obstetrics & Gynecology

## 2020-07-14 NOTE — Telephone Encounter (Signed)
Noted, will evaluate.  Agree with dermatology evaluation as well.

## 2020-07-17 ENCOUNTER — Encounter: Payer: Self-pay | Admitting: Primary Care

## 2020-07-17 ENCOUNTER — Other Ambulatory Visit: Payer: Self-pay

## 2020-07-17 ENCOUNTER — Ambulatory Visit (INDEPENDENT_AMBULATORY_CARE_PROVIDER_SITE_OTHER): Payer: PRIVATE HEALTH INSURANCE | Admitting: Primary Care

## 2020-07-17 VITALS — BP 126/89 | HR 77 | Temp 98.6°F | Ht 64.0 in | Wt 115.0 lb

## 2020-07-17 DIAGNOSIS — Z114 Encounter for screening for human immunodeficiency virus [HIV]: Secondary | ICD-10-CM

## 2020-07-17 DIAGNOSIS — E785 Hyperlipidemia, unspecified: Secondary | ICD-10-CM | POA: Diagnosis not present

## 2020-07-17 DIAGNOSIS — Z1159 Encounter for screening for other viral diseases: Secondary | ICD-10-CM | POA: Diagnosis not present

## 2020-07-17 DIAGNOSIS — D229 Melanocytic nevi, unspecified: Secondary | ICD-10-CM | POA: Diagnosis not present

## 2020-07-17 NOTE — Progress Notes (Signed)
Subjective:    Patient ID: [name] [name], female    DOB: Jan 27, 1966, 55 y.o.   MRN: [id number]  HPI  [name] [name] is a very pleasant 55 y.o. female with a history of tobacco abuse, migraines, allergic rhinitis, chronic joint pain who presents today for evaluation of nevus. She would also like to discuss cholesterol results.   Last cholesterol check was in march 2022 with LDL of 158. She was prescribed atorvastatin but had to discontinue due to rash and burning skin. These symptoms resolved after she discontinued. She is not currently managed on treatment. Since her last visit she's been working on her diet. She is due for cholesterol check.   Typically received annual skin cancer screenings, has not had one in four years. History of precancerous and cancerous lesions with removal. About two weeks ago she noticed a change in a nevus to the left posterior shoulder.    Review of Systems  Respiratory: Negative for shortness of breath.   Cardiovascular: Negative for chest pain.  Skin:       Nevus          Past Medical History:  Diagnosis Date  . GERD (gastroesophageal reflux disease)   . Migraine headache   . Osteoarthritis   . Palpitations   . Premenstrual dysphoria   . Viral syndrome     Social History   Socioeconomic History  . Marital status: Married    Spouse name: Not on file  . Number of children: 1  . Years of education: Not on file  . Highest education level: Not on file  Occupational History  . Occupation: FARMER    Employer: SELF EMPLOYED  Tobacco Use  . Smoking status: Current Every Day Smoker    Packs/day: 0.75    Years: 26.00    Pack years: 19.50    Types: Cigarettes  . Smokeless tobacco: Never Used  . Tobacco comment: pt given sheet  Vaping Use  . Vaping Use: Never used  Substance and Sexual Activity  . Alcohol use: Yes    Comment: Occassionally  . Drug use: No  . Sexual activity: Yes  Other Topics Concern  . Not on file  Social History Narrative   . Not on file   Social Determinants of Health   Financial Resource Strain: Not on file  Food Insecurity: Not on file  Transportation Needs: Not on file  Physical Activity: Not on file  Stress: Not on file  Social Connections: Not on file  Intimate Partner Violence: Not on file    Past Surgical History:  Procedure Laterality Date  . CESAREAN SECTION    . OVARIAN CYST SURGERY      Family History  Problem Relation Age of Onset  . Diabetes Brother   . Colon cancer Neg Hx   . Colon polyps Neg Hx   . Kidney disease Neg Hx     Allergies  Allergen Reactions  . Esomeprazole Magnesium   . Penicillins   . Sulfonamide Derivatives     Current Outpatient Medications on File Prior to Visit  Medication Sig Dispense Refill  . atenolol (TENORMIN) 25 MG tablet TAKE 1 TABLET (25 MG TOTAL) BY MOUTH 2 (TWO) TIMES DAILY. FOR HEADACHE PREVENTION. 180 tablet 0  . predniSONE (DELTASONE) 10 MG tablet Take 4 pills once daily by mouth for 3 days, then 3 pills daily for 3 days, then 2 pills daily for 3 days then 1 pill daily for 3 days then stop 30 tablet  0  . clobetasol cream (TEMOVATE) 2.68 % Apply 1 application topically 2 (two) times daily. 30 g 0   No current facility-administered medications on file prior to visit.    BP 126/89   Pulse 77   Temp 98.6 F (37 C) (Temporal)   Ht 5\' 4"  (1.626 m)   Wt 115 lb (52.2 kg)   SpO2 98%   BMI 19.74 kg/m  Objective:   Physical Exam Pulmonary:     Effort: Pulmonary effort is normal.  Skin:    General: Skin is warm and dry.     Comments: Dark brown, circular, slightly raised, nevus to left posterior shoulder measuring 0.25 cm.           Assessment & Plan:      This visit occurred during the SARS-CoV-2 public health emergency.  Safety protocols were in place, including screening questions prior to the visit, additional usage of staff PPE, and extensive cleaning of exam room while observing appropriate contact time as indicated for  disinfecting solutions.

## 2020-07-17 NOTE — Assessment & Plan Note (Signed)
Nevus in question today doesn't appear suspicious but she does need dermatology evaluation for several other nevi.  Referral placed.

## 2020-07-17 NOTE — Assessment & Plan Note (Signed)
Noted on prior labs with LDL of 158. Rash on atorvastatin.  Repeat lipid panel pending.

## 2020-07-17 NOTE — Patient Instructions (Signed)
Schedule a lab appointment to return fasting for your labs.   Also schedule the shingles vaccine during the lab day, and then a nurse visit for 2 to 6 months later.  You will be contacted regarding your referral to dermatology.  Please let us know if you have not been contacted within two weeks.   It was a pleasure to see you today!

## 2020-07-21 ENCOUNTER — Other Ambulatory Visit (INDEPENDENT_AMBULATORY_CARE_PROVIDER_SITE_OTHER): Payer: PRIVATE HEALTH INSURANCE

## 2020-07-21 ENCOUNTER — Other Ambulatory Visit: Payer: Self-pay

## 2020-07-21 DIAGNOSIS — Z1159 Encounter for screening for other viral diseases: Secondary | ICD-10-CM

## 2020-07-21 DIAGNOSIS — E785 Hyperlipidemia, unspecified: Secondary | ICD-10-CM | POA: Diagnosis not present

## 2020-07-21 DIAGNOSIS — Z114 Encounter for screening for human immunodeficiency virus [HIV]: Secondary | ICD-10-CM

## 2020-07-21 LAB — LIPID PANEL
Cholesterol: 199 mg/dL (ref 0–200)
HDL: 41.8 mg/dL (ref >39.00)
LDL Cholesterol: 140 mg/dL — ABNORMAL HIGH (ref 0–99)
NonHDL: 157.66
Total CHOL/HDL Ratio: 5
Triglycerides: 88 mg/dL (ref 0.0–149.0)
VLDL: 17.6 mg/dL (ref 0.0–40.0)

## 2020-07-21 LAB — HEPATIC FUNCTION PANEL
ALT: 12 U/L (ref 0–35)
AST: 12 U/L (ref 0–37)
Albumin: 4.4 g/dL (ref 3.5–5.2)
Alkaline Phosphatase: 86 U/L (ref 39–117)
Bilirubin, Direct: 0.1 mg/dL (ref 0.0–0.3)
Total Bilirubin: 0.7 mg/dL (ref 0.2–1.2)
Total Protein: 7 g/dL (ref 6.0–8.3)

## 2020-07-22 DIAGNOSIS — E785 Hyperlipidemia, unspecified: Secondary | ICD-10-CM

## 2020-07-22 LAB — HIV ANTIBODY (ROUTINE TESTING W REFLEX): HIV 1&2 Ab, 4th Generation: NONREACTIVE

## 2020-07-22 LAB — HEPATITIS C ANTIBODY
Hepatitis C Ab: NONREACTIVE
SIGNAL TO CUT-OFF: 0.01 (ref <1.00)

## 2020-07-23 MED ORDER — PRAVASTATIN SODIUM 40 MG PO TABS: 40.0000 mg | ORAL_TABLET | Freq: Every day | ORAL | 0 refills | Status: DC

## 2020-07-28 ENCOUNTER — Other Ambulatory Visit: Payer: Self-pay | Admitting: Primary Care

## 2020-07-28 DIAGNOSIS — G43709 Chronic migraine without aura, not intractable, without status migrainosus: Secondary | ICD-10-CM

## 2020-07-29 ENCOUNTER — Ambulatory Visit: Payer: PRIVATE HEALTH INSURANCE

## 2020-08-15 ENCOUNTER — Other Ambulatory Visit: Payer: Self-pay | Admitting: Primary Care

## 2020-08-15 DIAGNOSIS — E785 Hyperlipidemia, unspecified: Secondary | ICD-10-CM

## 2020-08-24 IMAGING — CT CT RENAL STONE PROTOCOL
3 of 4 series · 9 of 46 positions shown, 16 images · non-contrast
Comparison: [DATE] [DATE], [DATE]. [DATE] [DATE], [DATE].

CLINICAL DATA: Right lower back pain.

EXAM:
CT ABDOMEN AND PELVIS WITHOUT CONTRAST
TECHNIQUE: Multidetector CT imaging of the abdomen and pelvis was performed
following the standard protocol without IV contrast.

[Series 4: lung bases · axial · 0.64mm/px · z∈[-789,-709]mm · 5 of 26 slices shown, 10 images]
[im 5/26  soft-tissue]
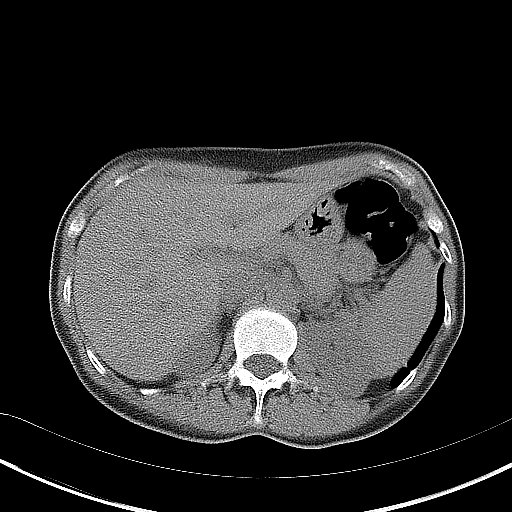
[im 5/26  bone]
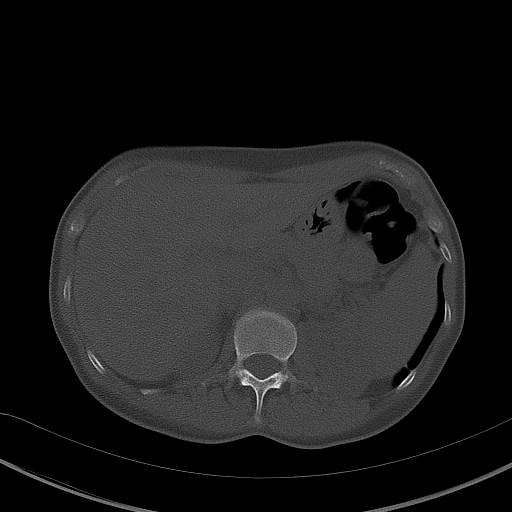
[im 9/26  soft-tissue]
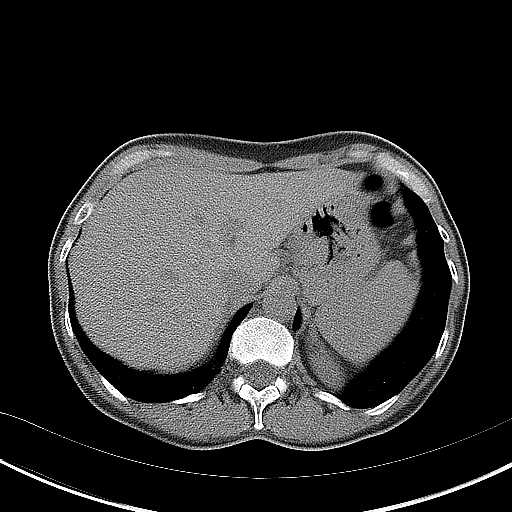
[im 9/26  lung]
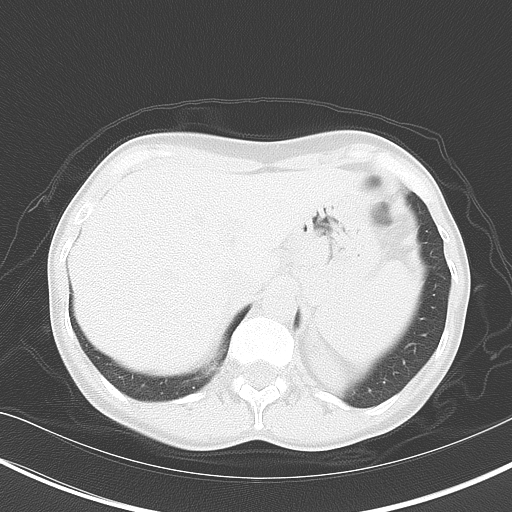
[im 13/26  soft-tissue]
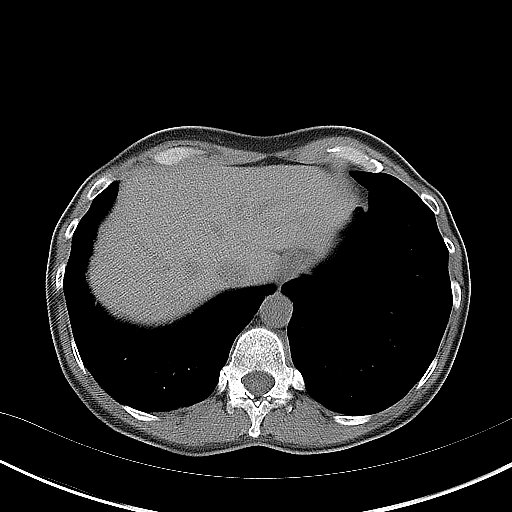
[im 13/26  lung]
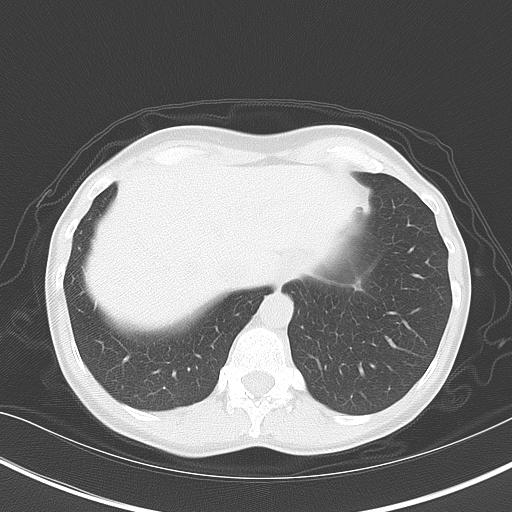
[im 17/26  soft-tissue]
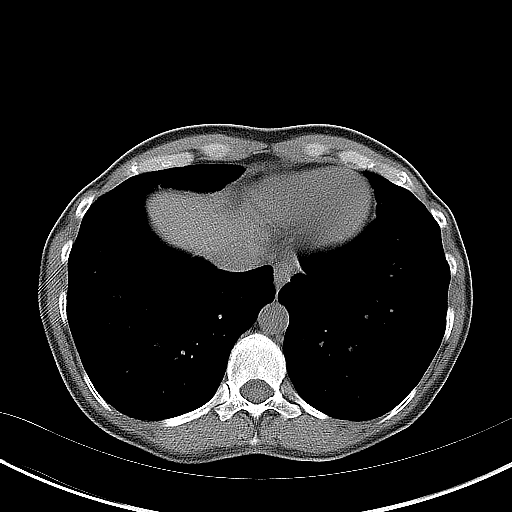
[im 17/26  lung]
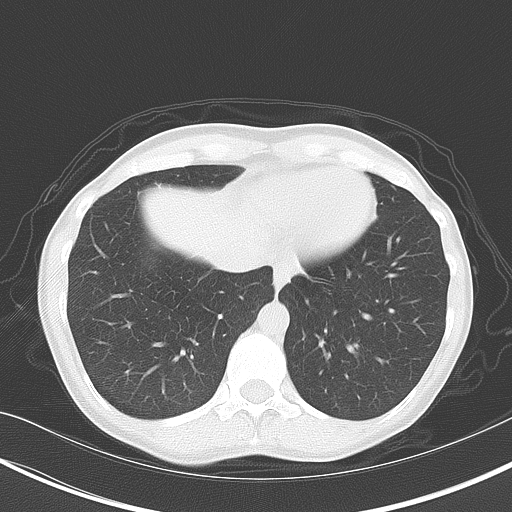
[im 21/26  soft-tissue]
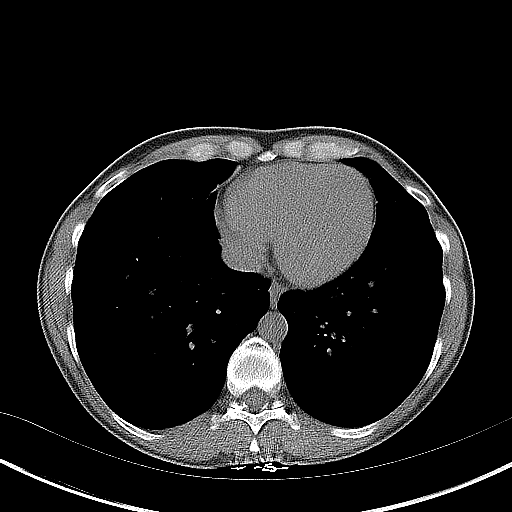
[im 21/26  lung]
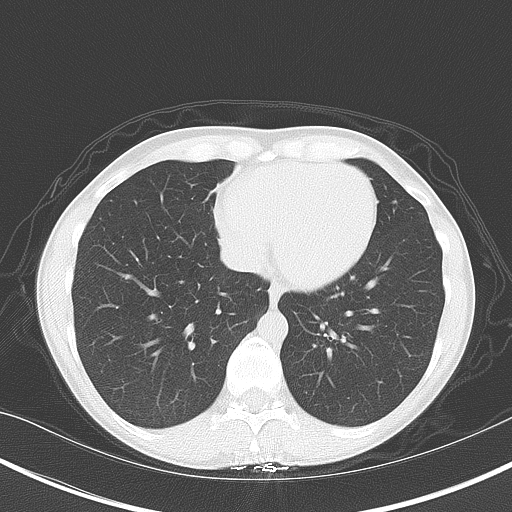

[Series 5: coronal · coronal · 0.62mm/px · 3 of 111 slices shown, 4 images]
[im 37/111  soft-tissue]
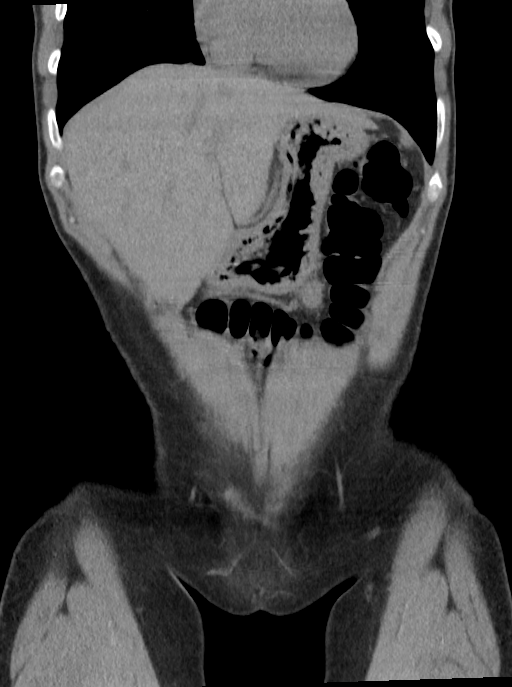
[im 49/111  soft-tissue]
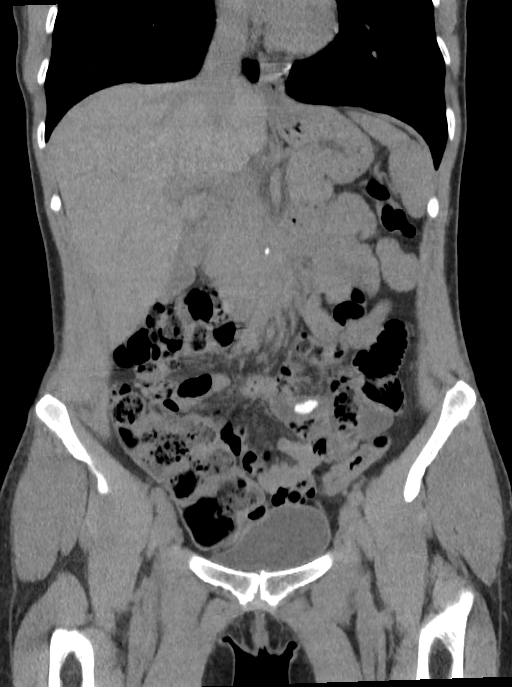
[im 49/111  bone]
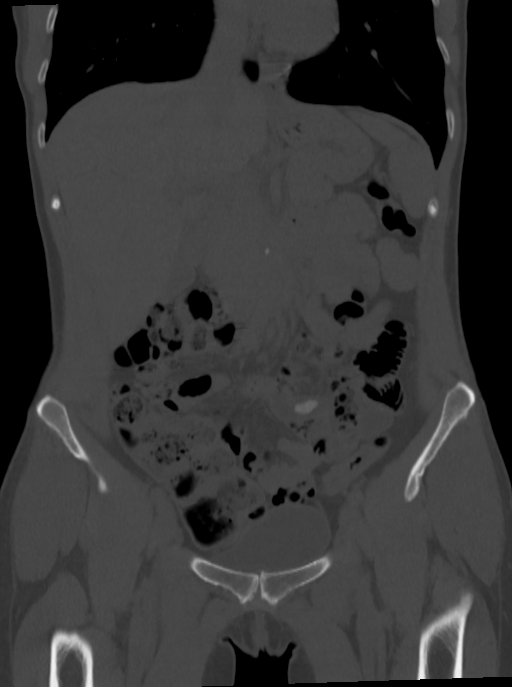
[im 62/111  soft-tissue]
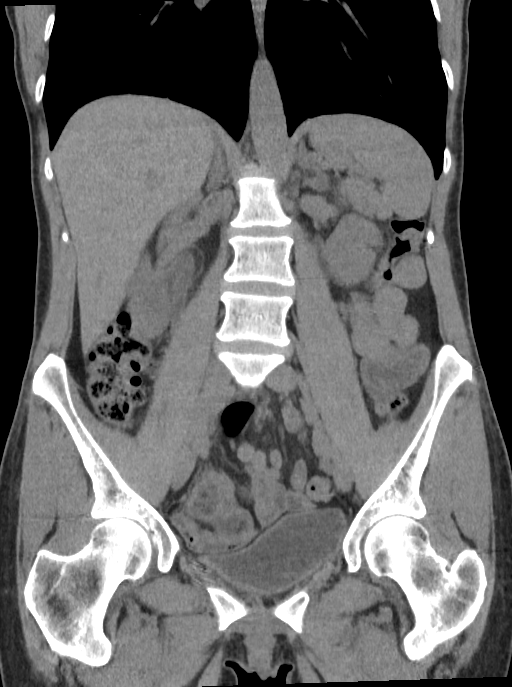

[Series 6: sagittal · sagittal · 0.46mm/px · 1 of 157 slices shown, 2 images]
[im 53/157  soft-tissue]
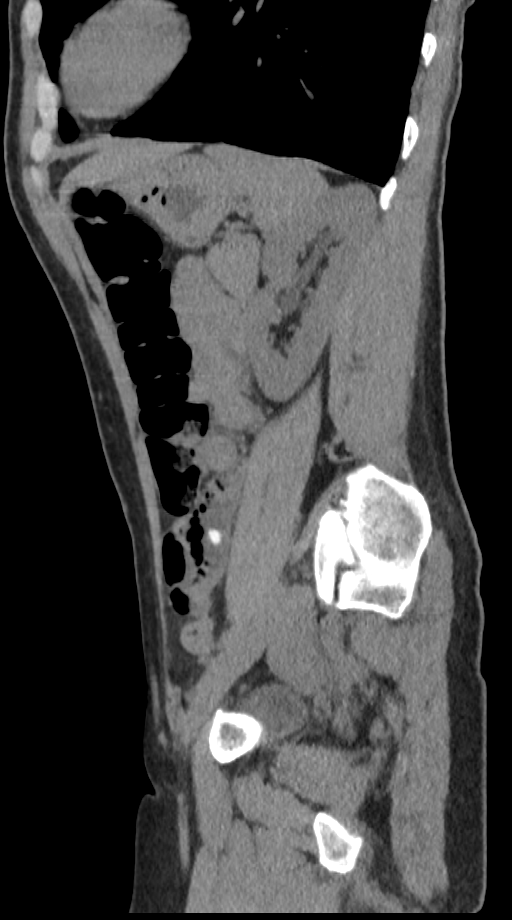
[im 53/157  bone]
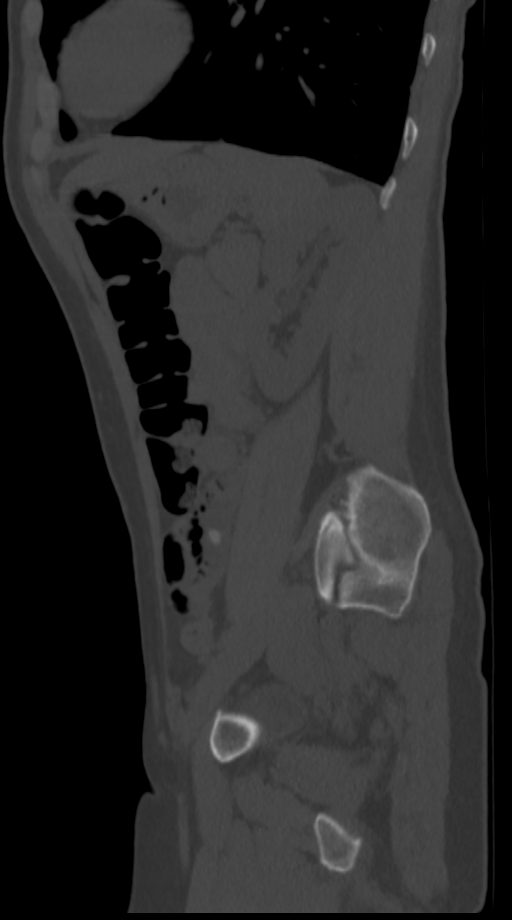

[9 of 46 positions shown; findings below may reference images not displayed]

FINDINGS: Lower chest: The lung bases are clear. The heart size is normal.

Hepatobiliary: The liver is normal. Normal gallbladder.There is no
biliary ductal dilation.

Pancreas: Normal contours without ductal dilatation. No
peripancreatic fluid collection.

Spleen: No splenic laceration or hematoma.

Adrenals/Urinary Tract:

--Adrenal glands: No adrenal hemorrhage.

--Right kidney/ureter: No hydronephrosis or perinephric hematoma.

--Left kidney/ureter: No hydronephrosis or perinephric hematoma.

--Urinary bladder: Unremarkable.

Stomach/Bowel:

--Stomach/Duodenum: No hiatal hernia or other gastric abnormality.
Normal duodenal course and caliber.

--Small bowel: No dilatation or inflammation.

--Colon: No focal abnormality.

--Appendix: Normal.

Vascular/Lymphatic: Atherosclerotic calcification is present within
the non-aneurysmal abdominal aorta, without hemodynamically
significant stenosis.

--No retroperitoneal lymphadenopathy.

--No mesenteric lymphadenopathy.

--No pelvic or inguinal lymphadenopathy.

Reproductive: Unremarkable

Other: No ascites or free air. The abdominal wall is normal.

Musculoskeletal. No acute displaced fractures.
IMPRESSION: No acute abdominopelvic abnormality.

Aortic Atherosclerosis ([id number]).

## 2020-11-25 ENCOUNTER — Ambulatory Visit: Payer: PRIVATE HEALTH INSURANCE | Admitting: Dermatology

## 2021-02-17 ENCOUNTER — Ambulatory Visit: Payer: Medicaid - Out of State | Admitting: Obstetrics & Gynecology

## 2021-04-15 ENCOUNTER — Telehealth: Payer: Self-pay | Admitting: Primary Care

## 2021-04-15 NOTE — Telephone Encounter (Signed)
°  Encourage patient to contact the pharmacy for refills or they can request refills through [facility]:  Please schedule appointment if longer than 1 year  NEXT APPOINTMENT DATE:  MEDICATION:predniSONE (DELTASONE) 10 MG tablet   Is the patient out of medication?   PHARMACY:[id number] [place], [place] - [address]  Let patient know to contact pharmacy at the end of the day to make sure medication is ready.  Please notify patient to allow 48-72 hours to process  CLINICAL FILLS OUT ALL BELOW:   LAST REFILL:  QTY:  REFILL DATE:    OTHER COMMENTS:    Okay for refill?  Please advise

## 2021-04-15 NOTE — Telephone Encounter (Signed)
Noted. Agree.

## 2021-04-15 NOTE — Telephone Encounter (Signed)
Called patient let know that she will need to be seen. She is leaving in the morning. I have given information with how to do virtual with [facility]. She will do that now.

## 2021-04-15 NOTE — Telephone Encounter (Signed)
See phone note

## 2021-04-23 ENCOUNTER — Ambulatory Visit: Payer: Medicaid - Out of State | Admitting: Obstetrics & Gynecology

## 2021-04-27 ENCOUNTER — Ambulatory Visit: Payer: Medicaid - Out of State | Admitting: Obstetrics & Gynecology

## 2021-04-28 ENCOUNTER — Ambulatory Visit: Payer: PRIVATE HEALTH INSURANCE | Admitting: Dermatology

## 2021-05-16 ENCOUNTER — Other Ambulatory Visit: Payer: Self-pay | Admitting: Primary Care

## 2021-05-16 DIAGNOSIS — G43709 Chronic migraine without aura, not intractable, without status migrainosus: Secondary | ICD-10-CM

## 2021-05-26 ENCOUNTER — Encounter: Payer: Self-pay | Admitting: Primary Care

## 2021-05-26 ENCOUNTER — Ambulatory Visit (INDEPENDENT_AMBULATORY_CARE_PROVIDER_SITE_OTHER): Payer: PRIVATE HEALTH INSURANCE | Admitting: Primary Care

## 2021-05-26 ENCOUNTER — Other Ambulatory Visit (HOSPITAL_COMMUNITY)
Admission: RE | Admit: 2021-05-26 | Discharge: 2021-05-26 | Disposition: A | Discharge status: Home / Self Care | Payer: Medicaid - Out of State | Source: Ambulatory Visit | Attending: Primary Care | Admitting: Primary Care

## 2021-05-26 ENCOUNTER — Other Ambulatory Visit: Payer: Self-pay

## 2021-05-26 VITALS — BP 112/64 | HR 82 | Temp 97.8°F | Ht 64.0 in | Wt 118.6 lb

## 2021-05-26 DIAGNOSIS — G43709 Chronic migraine without aura, not intractable, without status migrainosus: Secondary | ICD-10-CM | POA: Diagnosis not present

## 2021-05-26 DIAGNOSIS — Z122 Encounter for screening for malignant neoplasm of respiratory organs: Secondary | ICD-10-CM

## 2021-05-26 DIAGNOSIS — Z0000 Encounter for general adult medical examination without abnormal findings: Secondary | ICD-10-CM

## 2021-05-26 DIAGNOSIS — Z803 Family history of malignant neoplasm of breast: Secondary | ICD-10-CM

## 2021-05-26 DIAGNOSIS — Z124 Encounter for screening for malignant neoplasm of cervix: Secondary | ICD-10-CM | POA: Diagnosis present

## 2021-05-26 DIAGNOSIS — E785 Hyperlipidemia, unspecified: Secondary | ICD-10-CM | POA: Diagnosis not present

## 2021-05-26 DIAGNOSIS — Z1211 Encounter for screening for malignant neoplasm of colon: Secondary | ICD-10-CM

## 2021-05-26 DIAGNOSIS — R232 Flushing: Secondary | ICD-10-CM

## 2021-05-26 DIAGNOSIS — Z23 Encounter for immunization: Secondary | ICD-10-CM | POA: Diagnosis not present

## 2021-05-26 DIAGNOSIS — Z1231 Encounter for screening mammogram for malignant neoplasm of breast: Secondary | ICD-10-CM | POA: Diagnosis not present

## 2021-05-26 DIAGNOSIS — F3342 Major depressive disorder, recurrent, in full remission: Secondary | ICD-10-CM

## 2021-05-26 DIAGNOSIS — F17200 Nicotine dependence, unspecified, uncomplicated: Secondary | ICD-10-CM

## 2021-05-26 MED ORDER — NA SULFATE-K SULFATE-MG SULF 17.5-3.13-1.6 GM/177ML PO SOLN: 1.0000 | Freq: Once | ORAL | 0 refills | Status: AC

## 2021-05-26 NOTE — Assessment & Plan Note (Signed)
Resumed.  ? ?Offered treatment, she kindly declines.  ?Consider venlafaxine ER 37.5 mg daily.  ? ?She will notify if she changes her mind.  ?

## 2021-05-26 NOTE — Progress Notes (Signed)
? ?Subjective:  ? ? Patient ID: [name] [name], female    DOB: 08/08/1965, 56 y.o.   MRN: [id number] ? ?HPI ? ?[name] [name] is a very pleasant 56 y.o. female who presents today for complete physical and follow up of chronic conditions. ? ?She is interested in quitting smoking. She's not tried anything OTC or Rx. She plans on quitting later this Summer. She's smoked 1 PPD since 1986. She may sign up for a smoking cessation program. She has never completed lung cancer screening.  ? ?She would also like to discuss hot flashes. Chronic over the last year, increased over the last two months, occur during the day and night. Her last menstrual cycle was two years ago. She's not tried anything for hot flashes.  ? ?Immunizations: ?-Tetanus: Unsure ?-Influenza: Did not complete this season  ?-Covid-19: 2 vaccines ?-Shingles: Never completed, history of shingles  ?-Pneumonia: 2022 ? ?Diet: Healthy diet for the last year. She experienced a significant rash with both atorvastatin and pravastatin.  ?Exercise: No regular exercise. ? ?Eye exam: Completed years ago.  ?Dental exam: Completes semi-annually  ? ?Pap Smear: Completed in 2019 ?Mammogram: Completed years ago.  ?Colonoscopy: Never completed ? ?Lung Cancer Screening: Smoker for 30+ years, 1 PPD. Never completed lung cancer screening.  ? ? ? ? ? ?Review of Systems  ?Constitutional:  Negative for unexpected weight change.  ?HENT:  Negative for rhinorrhea.   ?Respiratory:  Negative for cough and shortness of breath.   ?Cardiovascular:  Negative for chest pain.  ?Gastrointestinal:  Negative for constipation and diarrhea.  ?Genitourinary:  Negative for difficulty urinating.  ?     Hot flashes  ?Musculoskeletal:  Negative for arthralgias and myalgias.  ?Skin:  Negative for rash.  ?Allergic/Immunologic: Negative for environmental allergies.  ?Neurological:  Negative for dizziness and headaches.  ?Psychiatric/Behavioral:  The patient is not nervous/anxious.   ? ?   ? ? ?Past Medical  History:  ?Diagnosis Date  ? GERD (gastroesophageal reflux disease)   ? Migraine headache   ? Osteoarthritis   ? Palpitations   ? Premenstrual dysphoria   ? Viral syndrome   ? ? ?Social History  ? ?Socioeconomic History  ? Marital status: Married  ?  Spouse name: Not on file  ? Number of children: 1  ? Years of education: Not on file  ? Highest education level: Not on file  ?Occupational History  ? Occupation: FARMER  ?  Employer: SELF EMPLOYED  ?Tobacco Use  ? Smoking status: Every Day  ?  Packs/day: 0.75  ?  Years: 26.00  ?  Pack years: 19.50  ?  Types: Cigarettes  ? Smokeless tobacco: Never  ? Tobacco comments:  ?  pt given sheet  ?Vaping Use  ? Vaping Use: Never used  ?Substance and Sexual Activity  ? Alcohol use: Yes  ?  Comment: Occassionally  ? Drug use: No  ? Sexual activity: Yes  ?Other Topics Concern  ? Not on file  ?Social History Narrative  ? Not on file  ? ?Social Determinants of Health  ? ?Financial Resource Strain: Not on file  ?Food Insecurity: Not on file  ?Transportation Needs: Not on file  ?Physical Activity: Not on file  ?Stress: Not on file  ?Social Connections: Not on file  ?Intimate Partner Violence: Not on file  ? ? ?Past Surgical History:  ?Procedure Laterality Date  ? CESAREAN SECTION    ? OVARIAN CYST SURGERY    ? ? ?Family History  ?Problem  Relation Age of Onset  ? Diabetes Brother   ? Colon cancer Neg Hx   ? Colon polyps Neg Hx   ? Kidney disease Neg Hx   ? ? ?Allergies  ?Allergen Reactions  ? Atorvastatin Rash  ? Esomeprazole Magnesium   ? Penicillins   ? Sulfonamide Derivatives   ? ? ?Current Outpatient Medications on File Prior to Visit  ?Medication Sig Dispense Refill  ? atenolol (TENORMIN) 25 MG tablet Take 1 tablet (25 mg total) by mouth 2 (two) times daily. For headache prevention. Office visit required for further refills. 180 tablet 0  ? clobetasol cream (TEMOVATE) 7.59 % Apply 1 application topically 2 (two) times daily. 30 g 0  ? ?No current facility-administered medications  on file prior to visit.  ? ? ?BP 112/64 (BP Location: Left Arm, Patient Position: Sitting, Cuff Size: Normal)   Pulse 82   Temp 97.8 ?F (36.6 ?C) (Oral)   Ht '5\' 4"'$  (1.626 m)   Wt 118 lb 9.6 oz (53.8 kg)   SpO2 96%   BMI 20.36 kg/m?  ?Objective:  ? Physical Exam ?Exam conducted with a chaperone present.  ?HENT:  ?   Right Ear: Tympanic membrane and ear canal normal.  ?   Left Ear: Tympanic membrane and ear canal normal.  ?   Nose: Nose normal.  ?Eyes:  ?   Conjunctiva/sclera: Conjunctivae normal.  ?   Pupils: Pupils are equal, round, and reactive to light.  ?Neck:  ?   Thyroid: No thyromegaly.  ?Cardiovascular:  ?   Rate and Rhythm: Normal rate and regular rhythm.  ?   Heart sounds: No murmur heard. ?Pulmonary:  ?   Effort: Pulmonary effort is normal.  ?   Breath sounds: Normal breath sounds. No rales.  ?Abdominal:  ?   General: Bowel sounds are normal.  ?   Palpations: Abdomen is soft.  ?   Tenderness: There is no abdominal tenderness.  ?Genitourinary: ?   Labia:     ?   Right: No tenderness or lesion.     ?   Left: No tenderness or lesion.   ?   Vagina: No vaginal discharge.  ?   Cervix: No cervical motion tenderness or discharge.  ?   Uterus: Normal.   ?   Adnexa: Right adnexa normal and left adnexa normal.  ?Musculoskeletal:     ?   General: Normal range of motion.  ?   Cervical back: Neck supple.  ?Lymphadenopathy:  ?   Cervical: No cervical adenopathy.  ?Skin: ?   General: Skin is warm and dry.  ?   Findings: No rash.  ?Neurological:  ?   Mental Status: She is alert and oriented to person, place, and time.  ?   Cranial Nerves: No cranial nerve deficit.  ?   Deep Tendon Reflexes: Reflexes are normal and symmetric.  ?Psychiatric:     ?   Mood and Affect: Mood normal.  ? ? ? ? ? ?   ?Assessment & Plan:  ? ? ? ? ?This visit occurred during the SARS-CoV-2 public health emergency.  Safety protocols were in place, including screening questions prior to the visit, additional usage of staff PPE, and extensive  cleaning of exam room while observing appropriate contact time as indicated for disinfecting solutions.  ?

## 2021-05-26 NOTE — Assessment & Plan Note (Signed)
Controlled. ?Infrequent migraines.  ? ?Continue atenolol 25 mg BID. ?

## 2021-05-26 NOTE — Assessment & Plan Note (Signed)
No recent mammogram on file. ?She agrees to have this done, orders placed. ?

## 2021-05-26 NOTE — Progress Notes (Signed)
Gastroenterology Pre-Procedure Review ? ?Request Date: 11/19/2021 ?Requesting Physician: Dr. Vicente Males ? ?PATIENT REVIEW QUESTIONS: The patient responded to the following health history questions as indicated:   ? ?1. Are you having any GI issues? no ?2. Do you have a personal history of Polyps? no ?3. Do you have a family history of Colon Cancer or Polyps? no ?4. Diabetes Mellitus? no ?5. Joint replacements in the past 12 months?no ?6. Major health problems in the past 3 months?no ?7. Any artificial heart valves, MVP, or defibrillator?no ?   ?MEDICATIONS & ALLERGIES:    ?Patient reports the following regarding taking any anticoagulation/antiplatelet therapy:   ?Plavix, Coumadin, Eliquis, Xarelto, Lovenox, Pradaxa, Brilinta, or Effient? no ?Aspirin? no ? ?Patient confirms/reports the following medications:  ?Current Outpatient Medications  ?Medication Sig Dispense Refill  ? atenolol (TENORMIN) 25 MG tablet Take 1 tablet (25 mg total) by mouth 2 (two) times daily. For headache prevention. Office visit required for further refills. 180 tablet 0  ? clobetasol cream (TEMOVATE) 6.14 % Apply 1 application topically 2 (two) times daily. 30 g 0  ? ?No current facility-administered medications for this visit.  ? ? ?Patient confirms/reports the following allergies:  ?Allergies  ?Allergen Reactions  ? Atorvastatin Rash  ? Esomeprazole Magnesium   ? Penicillins   ? Sulfonamide Derivatives   ? ? ?No orders of the defined types were placed in this encounter. ? ? ?AUTHORIZATION INFORMATION ?Primary Insurance: ?1D#: ?Group #: ? ?Secondary Insurance: ?1D#: ?Group #: ? ?SCHEDULE INFORMATION: ?Date: 11/19/2021 ?Time: ?Location:[facility] ? ?

## 2021-05-26 NOTE — Patient Instructions (Addendum)
Stop by the lab prior to leaving today. I will notify you of your results once received.  ? ?You will be contacted regarding your referral to GI for the colonoscopy and for the lung cancer screening program.  Please let us know if you have not been contacted within two weeks.  ? ?Call the [facility] to schedule your mammogram.  ? ?It was a pleasure to see you today! ? ?Preventive Care 40-54 Years Old, Female ?Preventive care refers to lifestyle choices and visits with your health care provider that can promote health and wellness. Preventive care visits are also called wellness exams. ?What can I expect for my preventive care visit? ?Counseling ?Your health care provider may ask you questions about your: ?Medical history, including: ?Past medical problems. ?Family medical history. ?Pregnancy history. ?Current health, including: ?Menstrual cycle. ?Method of birth control. ?Emotional well-being. ?Home life and relationship well-being. ?Sexual activity and sexual health. ?Lifestyle, including: ?Alcohol, nicotine or tobacco, and drug use. ?Access to firearms. ?Diet, exercise, and sleep habits. ?Work and work Statistician. ?Sunscreen use. ?Safety issues such as seatbelt and bike helmet use. ?Physical exam ?Your health care provider will check your: ?Height and weight. These may be used to calculate your BMI (body mass index). BMI is a measurement that tells if you are at a healthy weight. ?Waist circumference. This measures the distance around your waistline. This measurement also tells if you are at a healthy weight and may help predict your risk of certain diseases, such as type 2 diabetes and high blood pressure. ?Heart rate and blood pressure. ?Body temperature. ?Skin for abnormal spots. ?What immunizations do I need? ?Vaccines are usually given at various ages, according to a schedule. Your health care provider will recommend vaccines for you based on your age, medical history, and lifestyle or other factors, such  as travel or where you work. ?What tests do I need? ?Screening ?Your health care provider may recommend screening tests for certain conditions. This may include: ?Lipid and cholesterol levels. ?Diabetes screening. This is done by checking your blood sugar (glucose) after you have not eaten for a while (fasting). ?Pelvic exam and Pap test. ?Hepatitis B test. ?Hepatitis C test. ?HIV (human immunodeficiency virus) test. ?STI (sexually transmitted infection) testing, if you are at risk. ?Lung cancer screening. ?Colorectal cancer screening. ?Mammogram. Talk with your health care provider about when you should start having regular mammograms. This may depend on whether you have a family history of breast cancer. ?BRCA-related cancer screening. This may be done if you have a family history of breast, ovarian, tubal, or peritoneal cancers. ?Bone density scan. This is done to screen for osteoporosis. ?Talk with your health care provider about your test results, treatment options, and if necessary, the need for more tests. ?Follow these instructions at home: ?Eating and drinking ? ?Eat a diet that includes fresh fruits and vegetables, whole grains, lean protein, and low-fat dairy products. ?Take vitamin and mineral supplements as recommended by your health care provider. ?Do not drink alcohol if: ?Your health care provider tells you not to drink. ?You are pregnant, may be pregnant, or are planning to become pregnant. ?If you drink alcohol: ?Limit how much you have to 0-1 drink a day. ?Know how much alcohol is in your drink. In the [place] one drink equals one 12 oz bottle of beer (355 mL), one 5 oz glass of wine (148 mL), or one 1? oz glass of hard liquor (44 mL). ?Lifestyle ?Brush your teeth every morning and night with  fluoride toothpaste. Floss one time each day. ?Exercise for at least 30 minutes 5 or more days each week. ?Do not use any products that contain nicotine or tobacco. These products include cigarettes, chewing  tobacco, and vaping devices, such as e-cigarettes. If you need help quitting, ask your health care provider. ?Do not use drugs. ?If you are sexually active, practice safe sex. Use a condom or other form of protection to prevent STIs. ?If you do not wish to become pregnant, use a form of birth control. If you plan to become pregnant, see your health care provider for a prepregnancy visit. ?Take aspirin only as told by your health care provider. Make sure that you understand how much to take and what form to take. Work with your health care provider to find out whether it is safe and beneficial for you to take aspirin daily. ?Find healthy ways to manage stress, such as: ?Meditation, yoga, or listening to music. ?Journaling. ?Talking to a trusted person. ?Spending time with friends and family. ?Minimize exposure to UV radiation to reduce your risk of skin cancer. ?Safety ?Always wear your seat belt while driving or riding in a vehicle. ?Do not drive: ?If you have been drinking alcohol. Do not ride with someone who has been drinking. ?When you are tired or distracted. ?While texting. ?If you have been using any mind-altering substances or drugs. ?Wear a helmet and other protective equipment during sports activities. ?If you have firearms in your house, make sure you follow all gun safety procedures. ?Seek help if you have been physically or sexually abused. ?What's next? ?Visit your health care provider once a year for an annual wellness visit. ?Ask your health care provider how often you should have your eyes and teeth checked. ?Stay up to date on all vaccines. ?This information is not intended to replace advice given to you by your health care provider. Make sure you discuss any questions you have with your health care provider. ?Document Revised: 08/12/2020 Document Reviewed: 08/12/2020 ?Elsevier Patient Education ? [place]. ? ?

## 2021-05-26 NOTE — Assessment & Plan Note (Signed)
Significant rash with both atorvastatin or pravastatin.  ?Consider Zetia 10 mg daily. ? ?Repeat lipid panel pending.  ?

## 2021-05-26 NOTE — Assessment & Plan Note (Signed)
Overall denies concerns.  ? ?Consider venlafaxine ER 37.5 mg daily, she will notify if she changes her mind.  ?

## 2021-05-26 NOTE — Assessment & Plan Note (Signed)
Ready to quit, discussed OTC options including nicotine patches and gum.  She plans on signing up for a free smoking cessation program but will let me know if she needs prescriptions. ? ?Referral placed for lung cancer screening program. ?

## 2021-05-26 NOTE — Addendum Note (Signed)
Addended by: Earlyne Iba on: 05/26/2021 03:38 PM ? ? Modules accepted: Orders ? ?

## 2021-05-26 NOTE — Assessment & Plan Note (Signed)
Tdap provided today.  She declines Shingrix for now. ?Pap smear due, completed today. ?Mammogram overdue, orders placed. ?Colonoscopy overdue, referral placed to GI. ?Referral placed for lung cancer screening. ? ?Encouraged healthy diet and regular exercise. ? ?Exam stable, labs pending ?

## 2021-05-27 LAB — CBC
HCT: 39.9 % (ref 36.0–46.0)
Hemoglobin: 13.6 g/dL (ref 12.0–15.0)
MCHC: 34.1 g/dL (ref 30.0–36.0)
MCV: 94.6 fl (ref 78.0–100.0)
Platelets: 248 10*3/uL (ref 150.0–400.0)
RBC: 4.22 Mil/uL (ref 3.87–5.11)
RDW: 12.9 % (ref 11.5–15.5)
WBC: 6.1 10*3/uL (ref 4.0–10.5)

## 2021-05-27 LAB — COMPREHENSIVE METABOLIC PANEL
ALT: 10 U/L (ref 0–35)
AST: 15 U/L (ref 0–37)
Albumin: 4.6 g/dL (ref 3.5–5.2)
Alkaline Phosphatase: 86 U/L (ref 39–117)
BUN: 16 mg/dL (ref 6–23)
CO2: 28 mEq/L (ref 19–32)
Calcium: 9.5 mg/dL (ref 8.4–10.5)
Chloride: 105 mEq/L (ref 96–112)
Creatinine, Ser: 0.94 mg/dL (ref 0.40–1.20)
GFR: 69.09 mL/min (ref >60.00)
Glucose, Bld: 98 mg/dL (ref 70–99)
Potassium: 3.3 mEq/L — ABNORMAL LOW (ref 3.5–5.1)
Sodium: 141 mEq/L (ref 135–145)
Total Bilirubin: 0.8 mg/dL (ref 0.2–1.2)
Total Protein: 7 g/dL (ref 6.0–8.3)

## 2021-05-27 LAB — LIPID PANEL
Cholesterol: 191 mg/dL (ref 0–200)
HDL: 35.5 mg/dL — ABNORMAL LOW (ref >39.00)
LDL Cholesterol: 137 mg/dL — ABNORMAL HIGH (ref 0–99)
NonHDL: 155.77
Total CHOL/HDL Ratio: 5
Triglycerides: 92 mg/dL (ref 0.0–149.0)
VLDL: 18.4 mg/dL (ref 0.0–40.0)

## 2021-05-27 LAB — HEMOGLOBIN A1C: Hgb A1c MFr Bld: 6 % (ref 4.6–6.5)

## 2021-05-30 LAB — CYTOLOGY - PAP
Comment: NEGATIVE
Diagnosis: NEGATIVE
High risk HPV: NEGATIVE

## 2021-07-29 ENCOUNTER — Ambulatory Visit: Payer: PRIVATE HEALTH INSURANCE | Admitting: Dermatology

## 2021-08-16 ENCOUNTER — Other Ambulatory Visit: Payer: Self-pay | Admitting: Primary Care

## 2021-08-16 DIAGNOSIS — G43709 Chronic migraine without aura, not intractable, without status migrainosus: Secondary | ICD-10-CM

## 2021-11-19 ENCOUNTER — Encounter: Admission: RE | Payer: Self-pay | Source: Home / Self Care

## 2021-11-19 ENCOUNTER — Ambulatory Visit
Admission: RE | Admit: 2021-11-19 | Payer: Medicaid - Out of State | Source: Home / Self Care | Admitting: Gastroenterology

## 2021-11-19 SURGERY — COLONOSCOPY WITH PROPOFOL
Anesthesia: General

## 2021-11-23 ENCOUNTER — Telehealth: Payer: Self-pay | Admitting: Primary Care

## 2021-11-23 NOTE — Telephone Encounter (Signed)
Lft pt vm to call [facility] to sch appt. thanks

## 2022-05-09 ENCOUNTER — Other Ambulatory Visit: Payer: Self-pay | Admitting: Primary Care

## 2022-05-09 DIAGNOSIS — G43709 Chronic migraine without aura, not intractable, without status migrainosus: Secondary | ICD-10-CM

## 2022-05-09 NOTE — Telephone Encounter (Signed)
Patient is due for CPE/follow up in [place] [place], this will be required prior to any further refills.  Please schedule, thank you!   

## 2022-05-10 NOTE — Telephone Encounter (Signed)
Spoke to pt, scheduled cpe for 06/09/22

## 2022-05-26 ENCOUNTER — Other Ambulatory Visit: Payer: Self-pay | Admitting: Primary Care

## 2022-05-26 DIAGNOSIS — G43709 Chronic migraine without aura, not intractable, without status migrainosus: Secondary | ICD-10-CM

## 2022-06-09 ENCOUNTER — Encounter: Payer: Self-pay | Admitting: Primary Care

## 2022-06-09 ENCOUNTER — Ambulatory Visit (INDEPENDENT_AMBULATORY_CARE_PROVIDER_SITE_OTHER): Payer: Self-pay | Admitting: Primary Care

## 2022-06-09 VITALS — BP 128/72 | HR 75 | Temp 97.9°F | Ht 64.0 in | Wt 111.0 lb

## 2022-06-09 DIAGNOSIS — Z1231 Encounter for screening mammogram for malignant neoplasm of breast: Secondary | ICD-10-CM

## 2022-06-09 DIAGNOSIS — K219 Gastro-esophageal reflux disease without esophagitis: Secondary | ICD-10-CM

## 2022-06-09 DIAGNOSIS — G43009 Migraine without aura, not intractable, without status migrainosus: Secondary | ICD-10-CM

## 2022-06-09 DIAGNOSIS — F17200 Nicotine dependence, unspecified, uncomplicated: Secondary | ICD-10-CM

## 2022-06-09 DIAGNOSIS — M549 Dorsalgia, unspecified: Secondary | ICD-10-CM

## 2022-06-09 DIAGNOSIS — G8929 Other chronic pain: Secondary | ICD-10-CM

## 2022-06-09 DIAGNOSIS — E785 Hyperlipidemia, unspecified: Secondary | ICD-10-CM

## 2022-06-09 DIAGNOSIS — R7303 Prediabetes: Secondary | ICD-10-CM

## 2022-06-09 DIAGNOSIS — L237 Allergic contact dermatitis due to plants, except food: Secondary | ICD-10-CM

## 2022-06-09 DIAGNOSIS — F3342 Major depressive disorder, recurrent, in full remission: Secondary | ICD-10-CM

## 2022-06-09 DIAGNOSIS — Z0000 Encounter for general adult medical examination without abnormal findings: Secondary | ICD-10-CM

## 2022-06-09 DIAGNOSIS — Z803 Family history of malignant neoplasm of breast: Secondary | ICD-10-CM

## 2022-06-09 LAB — COMPREHENSIVE METABOLIC PANEL
ALT: 9 U/L (ref 0–35)
AST: 13 U/L (ref 0–37)
Albumin: 4.3 g/dL (ref 3.5–5.2)
Alkaline Phosphatase: 85 U/L (ref 39–117)
BUN: 14 mg/dL (ref 6–23)
CO2: 31 mEq/L (ref 19–32)
Calcium: 9.5 mg/dL (ref 8.4–10.5)
Chloride: 106 mEq/L (ref 96–112)
Creatinine, Ser: 0.79 mg/dL (ref 0.40–1.20)
GFR: 84.5 mL/min (ref >60.00)
Glucose, Bld: 80 mg/dL (ref 70–99)
Potassium: 3.8 mEq/L (ref 3.5–5.1)
Sodium: 143 mEq/L (ref 135–145)
Total Bilirubin: 0.5 mg/dL (ref 0.2–1.2)
Total Protein: 6.7 g/dL (ref 6.0–8.3)

## 2022-06-09 LAB — LIPID PANEL
Cholesterol: 167 mg/dL (ref 0–200)
HDL: 33.6 mg/dL — ABNORMAL LOW (ref >39.00)
LDL Cholesterol: 107 mg/dL — ABNORMAL HIGH (ref 0–99)
NonHDL: 132.95
Total CHOL/HDL Ratio: 5
Triglycerides: 128 mg/dL (ref 0.0–149.0)
VLDL: 25.6 mg/dL (ref 0.0–40.0)

## 2022-06-09 LAB — HEMOGLOBIN A1C: Hgb A1c MFr Bld: 5.7 % (ref 4.6–6.5)

## 2022-06-09 NOTE — Assessment & Plan Note (Signed)
Referral placed for lung cancer screening.  

## 2022-06-09 NOTE — Progress Notes (Signed)
Subjective:    Patient ID: [name] [name], female    DOB: [birth year], 57 y.o.   MRN: [id number]  HPI  [name] [name] is a very pleasant 57 y.o. female who presents today for complete physical and follow up of chronic conditions.  Immunizations: -Tetanus: Completed in 2023 -Shingles: Never completed, declines  -Pneumonia: Completed pneumovax in 2022  Diet: Fair diet.  Exercise: No regular exercise.  Eye exam: Completes annually  Dental exam: Completes semi-annually    Pap Smear: 2023 Mammogram: years ago   Colonoscopy: Never completed, declined last year. Declines for now.  Lung Cancer Screening: Never completed. Smoker since the age of 16, smokes 1 PPD.   BP Readings from Last 3 Encounters:  06/09/22 128/72  05/26/21 112/64  07/17/20 126/89       Review of Systems  Constitutional:  Negative for unexpected weight change.  HENT:  Negative for rhinorrhea.   Respiratory:  Negative for cough and shortness of breath.   Cardiovascular:  Negative for chest pain.  Gastrointestinal:  Negative for constipation and diarrhea.  Genitourinary:  Negative for difficulty urinating and menstrual problem.  Musculoskeletal:  Negative for arthralgias and myalgias.  Skin:  Negative for rash.  Allergic/Immunologic: Negative for environmental allergies.  Neurological:  Negative for dizziness, numbness and headaches.  Psychiatric/Behavioral:  The patient is not nervous/anxious.          Past Medical History:  Diagnosis Date   GERD (gastroesophageal reflux disease)    Migraine headache    Osteoarthritis    Palpitations    Premenstrual dysphoria    Tick bite of right thigh 07/08/2020   Viral syndrome     Social History   Socioeconomic History   Marital status: Married    Spouse name: Not on file   Number of children: 1   Years of education: Not on file   Highest education level: Not on file  Occupational History   Occupation: FARMER    Employer: [employer] EMPLOYED  Tobacco Use    Smoking status: Every Day    Packs/day: 0.75    Years: 26.00    Additional pack years: 0.00    Total pack years: 19.50    Types: Cigarettes   Smokeless tobacco: Never   Tobacco comments:    pt given sheet  Vaping Use   Vaping Use: Never used  Substance and Sexual Activity   Alcohol use: Yes    Comment: Occassionally   Drug use: No   Sexual activity: Yes  Other Topics Concern   Not on file  Social History Narrative   Not on file   Social Determinants of Health   Financial Resource Strain: Not on file  Food Insecurity: Not on file  Transportation Needs: Not on file  Physical Activity: Not on file  Stress: Not on file  Social Connections: Not on file  Intimate Partner Violence: Not on file    Past Surgical History:  Procedure Laterality Date   CESAREAN SECTION     OVARIAN CYST SURGERY      Family History  Problem Relation Age of Onset   Diabetes Brother    Colon cancer Neg Hx    Colon polyps Neg Hx    Kidney disease Neg Hx     Allergies  Allergen Reactions   Atorvastatin Rash   Esomeprazole Magnesium    Penicillins    Sulfonamide Derivatives     Current Outpatient Medications on File Prior to Visit  Medication Sig Dispense Refill  atenolol (TENORMIN) 25 MG tablet TAKE 1 TABLET (25 MG TOTAL) BY MOUTH 2 (TWO) TIMES DAILY. FOR HEADACHE PREVENTION 60 tablet 0   clobetasol cream (TEMOVATE) 0.05 % Apply 1 application topically 2 (two) times daily. 30 g 0   No current facility-administered medications on file prior to visit.    BP 128/72   Pulse 75   Temp 97.9 F (36.6 C) (Temporal)   Ht 5\' 4"  (1.626 m)   Wt 111 lb (50.3 kg)   SpO2 97%   BMI 19.05 kg/m  Objective:   Physical Exam HENT:     Right Ear: Tympanic membrane and ear canal normal.     Left Ear: Tympanic membrane and ear canal normal.     Nose: Nose normal.  Eyes:     Conjunctiva/sclera: Conjunctivae normal.     Pupils: Pupils are equal, round, and reactive to light.  Neck:     Thyroid:  No thyromegaly.  Cardiovascular:     Rate and Rhythm: Normal rate and regular rhythm.     Heart sounds: No murmur heard. Pulmonary:     Effort: Pulmonary effort is normal.     Breath sounds: Normal breath sounds. No rales.  Abdominal:     General: Bowel sounds are normal.     Palpations: Abdomen is soft.     Tenderness: There is no abdominal tenderness.  Musculoskeletal:        General: Normal range of motion.     Cervical back: Neck supple.  Lymphadenopathy:     Cervical: No cervical adenopathy.  Skin:    General: Skin is warm and dry.     Findings: No rash.  Neurological:     Mental Status: She is alert and oriented to person, place, and time.     Cranial Nerves: No cranial nerve deficit.     Deep Tendon Reflexes: Reflexes are normal and symmetric.  Psychiatric:        Mood and Affect: Mood normal.           Assessment & Plan:  Preventative health care Assessment & Plan: Shingrix due, declines today. Pap smear UTD. Mammogram due, orders placed. Colonoscopy overdue, she declines for now as she recently started a new job.  Discussed the importance of a healthy diet and regular exercise in order for weight loss, and to reduce the risk of further co-morbidity.  Exam stable. Labs pending.  Follow up in 1 year for repeat physical.    Tobacco use disorder Assessment & Plan: Referral placed for lung cancer screening.   Orders: -     Ambulatory Referral for Lung Cancer Scre  Screening mammogram for breast cancer -     3D Screening Mammogram, Left and Right; Future  Hyperlipidemia, unspecified hyperlipidemia type Assessment & Plan: Allergy to statins.  Repeat lipid panel pending.  Orders: -     Lipid panel -     Comprehensive metabolic panel  Family history of breast cancer Assessment & Plan: Strongly encouraged she complete a mammogram. She agrees, orders placed.    Gastroesophageal reflux disease without esophagitis Assessment &  Plan: Controlled.  No concerns today.   Migraine without aura and without status migrainosus, not intractable Assessment & Plan: Controlled.  Continue atenolol 25 mg BID.    Chronic midline back pain, unspecified back location Assessment & Plan: Continued.  She is working on stretching and walking. Continue to monitor.    Recurrent major depressive disorder, in full remission Assessment & Plan: Waxes and wanes.  No concerns today. Continue to monitor.    Poison ivy dermatitis Assessment & Plan: Mild active case today to left upper extremity. Continue clobetasol 0.05% cream PRN which is effective.   Prediabetes Assessment & Plan: Repeat A1C pending.  Orders: -     Hemoglobin A1c        Doreene NestKatherine K Neviah Braud, NP

## 2022-06-09 NOTE — Assessment & Plan Note (Signed)
Repeat A1C pending. 

## 2022-06-09 NOTE — Assessment & Plan Note (Signed)
Waxes and wanes.  No concerns today. Continue to monitor.

## 2022-06-09 NOTE — Assessment & Plan Note (Signed)
Allergy to statins.  Repeat lipid panel pending.

## 2022-06-09 NOTE — Assessment & Plan Note (Signed)
Controlled. No concerns today. 

## 2022-06-09 NOTE — Patient Instructions (Signed)
Stop by the lab prior to leaving today. I will notify you of your results once received.   [facility] to schedule your [facility].   Consider the colonoscopy.  You will either be contacted via phone regarding your referral to lung cancer screening, or you may receive a letter on your MyChart portal from our referral team with instructions for scheduling an appointment. Please let us know if you have not been contacted by anyone within two weeks.  It was a pleasure to see you today!

## 2022-06-09 NOTE — Assessment & Plan Note (Signed)
Strongly encouraged she complete a mammogram. She agrees, orders placed.

## 2022-06-09 NOTE — Assessment & Plan Note (Signed)
Shingrix due, declines today. Pap smear UTD. Mammogram due, orders placed. Colonoscopy overdue, she declines for now as she recently started a new job.  Discussed the importance of a healthy diet and regular exercise in order for weight loss, and to reduce the risk of further co-morbidity.  Exam stable. Labs pending.  Follow up in 1 year for repeat physical.

## 2022-06-09 NOTE — Assessment & Plan Note (Signed)
Controlled.  Continue atenolol 25 mg BID.

## 2022-06-09 NOTE — Assessment & Plan Note (Signed)
Mild active case today to left upper extremity. Continue clobetasol 0.05% cream PRN which is effective.

## 2022-06-09 NOTE — Assessment & Plan Note (Signed)
Continued.  She is working on stretching and walking. Continue to monitor.

## 2022-09-30 ENCOUNTER — Encounter: Payer: Self-pay | Admitting: *Deleted

## 2024-03-11 LAB — OPHTHALMOLOGY REPORT-SCANNED
